# Patient Record
Sex: Male | Born: 1985 | Hispanic: Yes | Marital: Married | Smoking: Former smoker
Health system: Southern US, Community
[De-identification: ages and names within clinical notes are randomized; demographics above are authoritative.]

## PROBLEM LIST (undated history)

## (undated) DIAGNOSIS — E785 Hyperlipidemia, unspecified: Secondary | ICD-10-CM

## (undated) DIAGNOSIS — M791 Myalgia, unspecified site: Secondary | ICD-10-CM

## (undated) DIAGNOSIS — E7800 Pure hypercholesterolemia, unspecified: Secondary | ICD-10-CM

## (undated) DIAGNOSIS — R7989 Other specified abnormal findings of blood chemistry: Secondary | ICD-10-CM

## (undated) DIAGNOSIS — A6000 Herpesviral infection of urogenital system, unspecified: Secondary | ICD-10-CM

## (undated) DIAGNOSIS — F419 Anxiety disorder, unspecified: Secondary | ICD-10-CM

## (undated) DIAGNOSIS — Z9109 Other allergy status, other than to drugs and biological substances: Secondary | ICD-10-CM

## (undated) DIAGNOSIS — T7840XA Allergy, unspecified, initial encounter: Secondary | ICD-10-CM

## (undated) DIAGNOSIS — F524 Premature ejaculation: Secondary | ICD-10-CM

## (undated) DIAGNOSIS — B360 Pityriasis versicolor: Secondary | ICD-10-CM

## (undated) DIAGNOSIS — R079 Chest pain, unspecified: Secondary | ICD-10-CM

## (undated) DIAGNOSIS — E236 Other disorders of pituitary gland: Secondary | ICD-10-CM

## (undated) HISTORY — DX: Herpesviral infection of urogenital system, unspecified: A60.00

## (undated) HISTORY — DX: Other allergy status, other than to drugs and biological substances: Z91.09

## (undated) HISTORY — DX: Pure hypercholesterolemia, unspecified: E78.00

## (undated) HISTORY — DX: Myalgia, unspecified site: M79.10

## (undated) HISTORY — DX: Other disorders of pituitary gland: E23.6

## (undated) HISTORY — DX: Pityriasis versicolor: B36.0

## (undated) HISTORY — DX: Chest pain, unspecified: R07.9

## (undated) HISTORY — DX: Anxiety disorder, unspecified: F41.9

## (undated) HISTORY — DX: Allergy, unspecified, initial encounter: T78.40XA

## (undated) HISTORY — DX: Premature ejaculation: F52.4

## (undated) HISTORY — DX: Hyperlipidemia, unspecified: E78.5

---

## 2011-11-27 ENCOUNTER — Other Ambulatory Visit: Payer: Self-pay | Admitting: Allergy and Immunology

## 2011-11-27 ENCOUNTER — Ambulatory Visit
Admission: RE | Admit: 2011-11-27 | Discharge: 2011-11-27 | Disposition: A | Discharge status: Home / Self Care | Payer: 59 | Source: Ambulatory Visit | Attending: Allergy and Immunology | Admitting: Allergy and Immunology

## 2011-11-27 DIAGNOSIS — J45909 Unspecified asthma, uncomplicated: Secondary | ICD-10-CM

## 2016-06-11 ENCOUNTER — Other Ambulatory Visit: Payer: Self-pay | Admitting: Urology

## 2016-06-11 ENCOUNTER — Other Ambulatory Visit (HOSPITAL_COMMUNITY): Payer: Self-pay | Admitting: Urology

## 2016-06-11 DIAGNOSIS — E290 Testicular hyperfunction: Secondary | ICD-10-CM

## 2016-06-11 DIAGNOSIS — N62 Hypertrophy of breast: Secondary | ICD-10-CM

## 2016-06-16 ENCOUNTER — Ambulatory Visit (HOSPITAL_COMMUNITY): Admission: RE | Admit: 2016-06-16 | Payer: 59 | Source: Ambulatory Visit

## 2016-06-16 ENCOUNTER — Ambulatory Visit
Admission: RE | Admit: 2016-06-16 | Discharge: 2016-06-16 | Disposition: A | Discharge status: Home / Self Care | Payer: 59 | Source: Ambulatory Visit | Attending: Urology | Admitting: Urology

## 2016-06-16 ENCOUNTER — Other Ambulatory Visit: Payer: Self-pay | Admitting: Urology

## 2016-06-16 DIAGNOSIS — N62 Hypertrophy of breast: Secondary | ICD-10-CM

## 2016-07-06 ENCOUNTER — Ambulatory Visit (HOSPITAL_COMMUNITY)
Admission: RE | Admit: 2016-07-06 | Discharge: 2016-07-06 | Disposition: A | Discharge status: Home / Self Care | Payer: Managed Care, Other (non HMO) | Source: Ambulatory Visit | Attending: Urology | Admitting: Urology

## 2016-07-06 DIAGNOSIS — E290 Testicular hyperfunction: Secondary | ICD-10-CM | POA: Insufficient documentation

## 2016-07-06 MED ORDER — GADOBENATE DIMEGLUMINE 529 MG/ML IV SOLN
10.0000 mL | Freq: Once | INTRAVENOUS | Status: AC | PRN
  Administered 2016-07-06: 7 mL via INTRAVENOUS

## 2016-09-17 ENCOUNTER — Other Ambulatory Visit (HOSPITAL_COMMUNITY): Payer: Self-pay | Admitting: Family Medicine

## 2016-09-17 DIAGNOSIS — R011 Cardiac murmur, unspecified: Secondary | ICD-10-CM

## 2016-09-28 ENCOUNTER — Ambulatory Visit (HOSPITAL_COMMUNITY): Payer: Managed Care, Other (non HMO)

## 2018-06-23 DIAGNOSIS — E291 Testicular hypofunction: Secondary | ICD-10-CM | POA: Diagnosis not present

## 2018-06-29 DIAGNOSIS — B350 Tinea barbae and tinea capitis: Secondary | ICD-10-CM | POA: Diagnosis not present

## 2018-06-29 DIAGNOSIS — L590 Erythema ab igne [dermatitis ab igne]: Secondary | ICD-10-CM | POA: Diagnosis not present

## 2019-01-04 DIAGNOSIS — F4323 Adjustment disorder with mixed anxiety and depressed mood: Secondary | ICD-10-CM | POA: Diagnosis not present

## 2019-01-11 DIAGNOSIS — F4323 Adjustment disorder with mixed anxiety and depressed mood: Secondary | ICD-10-CM | POA: Diagnosis not present

## 2019-01-30 DIAGNOSIS — M9901 Segmental and somatic dysfunction of cervical region: Secondary | ICD-10-CM | POA: Diagnosis not present

## 2019-01-30 DIAGNOSIS — M9903 Segmental and somatic dysfunction of lumbar region: Secondary | ICD-10-CM | POA: Diagnosis not present

## 2019-01-30 DIAGNOSIS — F4323 Adjustment disorder with mixed anxiety and depressed mood: Secondary | ICD-10-CM | POA: Diagnosis not present

## 2019-01-30 DIAGNOSIS — M5416 Radiculopathy, lumbar region: Secondary | ICD-10-CM | POA: Diagnosis not present

## 2019-01-30 DIAGNOSIS — M9905 Segmental and somatic dysfunction of pelvic region: Secondary | ICD-10-CM | POA: Diagnosis not present

## 2019-02-08 DIAGNOSIS — M9905 Segmental and somatic dysfunction of pelvic region: Secondary | ICD-10-CM | POA: Diagnosis not present

## 2019-02-08 DIAGNOSIS — M9903 Segmental and somatic dysfunction of lumbar region: Secondary | ICD-10-CM | POA: Diagnosis not present

## 2019-02-08 DIAGNOSIS — M5416 Radiculopathy, lumbar region: Secondary | ICD-10-CM | POA: Diagnosis not present

## 2019-02-08 DIAGNOSIS — M9901 Segmental and somatic dysfunction of cervical region: Secondary | ICD-10-CM | POA: Diagnosis not present

## 2019-02-09 DIAGNOSIS — F4323 Adjustment disorder with mixed anxiety and depressed mood: Secondary | ICD-10-CM | POA: Diagnosis not present

## 2019-02-15 DIAGNOSIS — M5416 Radiculopathy, lumbar region: Secondary | ICD-10-CM | POA: Diagnosis not present

## 2019-02-15 DIAGNOSIS — M9903 Segmental and somatic dysfunction of lumbar region: Secondary | ICD-10-CM | POA: Diagnosis not present

## 2019-02-15 DIAGNOSIS — M9901 Segmental and somatic dysfunction of cervical region: Secondary | ICD-10-CM | POA: Diagnosis not present

## 2019-02-15 DIAGNOSIS — M9905 Segmental and somatic dysfunction of pelvic region: Secondary | ICD-10-CM | POA: Diagnosis not present

## 2019-02-22 DIAGNOSIS — M5416 Radiculopathy, lumbar region: Secondary | ICD-10-CM | POA: Diagnosis not present

## 2019-02-22 DIAGNOSIS — M9903 Segmental and somatic dysfunction of lumbar region: Secondary | ICD-10-CM | POA: Diagnosis not present

## 2019-02-22 DIAGNOSIS — M9901 Segmental and somatic dysfunction of cervical region: Secondary | ICD-10-CM | POA: Diagnosis not present

## 2019-02-22 DIAGNOSIS — M9905 Segmental and somatic dysfunction of pelvic region: Secondary | ICD-10-CM | POA: Diagnosis not present

## 2019-03-09 DIAGNOSIS — E291 Testicular hypofunction: Secondary | ICD-10-CM | POA: Diagnosis not present

## 2019-03-09 DIAGNOSIS — Z3009 Encounter for other general counseling and advice on contraception: Secondary | ICD-10-CM | POA: Diagnosis not present

## 2019-03-10 DIAGNOSIS — M9905 Segmental and somatic dysfunction of pelvic region: Secondary | ICD-10-CM | POA: Diagnosis not present

## 2019-03-10 DIAGNOSIS — M5416 Radiculopathy, lumbar region: Secondary | ICD-10-CM | POA: Diagnosis not present

## 2019-03-10 DIAGNOSIS — M9903 Segmental and somatic dysfunction of lumbar region: Secondary | ICD-10-CM | POA: Diagnosis not present

## 2019-03-10 DIAGNOSIS — M9901 Segmental and somatic dysfunction of cervical region: Secondary | ICD-10-CM | POA: Diagnosis not present

## 2019-03-15 DIAGNOSIS — F4323 Adjustment disorder with mixed anxiety and depressed mood: Secondary | ICD-10-CM | POA: Diagnosis not present

## 2019-03-29 DIAGNOSIS — M5416 Radiculopathy, lumbar region: Secondary | ICD-10-CM | POA: Diagnosis not present

## 2019-03-29 DIAGNOSIS — M9901 Segmental and somatic dysfunction of cervical region: Secondary | ICD-10-CM | POA: Diagnosis not present

## 2019-03-29 DIAGNOSIS — M9903 Segmental and somatic dysfunction of lumbar region: Secondary | ICD-10-CM | POA: Diagnosis not present

## 2019-03-29 DIAGNOSIS — M9905 Segmental and somatic dysfunction of pelvic region: Secondary | ICD-10-CM | POA: Diagnosis not present

## 2019-03-31 DIAGNOSIS — Z302 Encounter for sterilization: Secondary | ICD-10-CM | POA: Diagnosis not present

## 2019-04-19 DIAGNOSIS — M9903 Segmental and somatic dysfunction of lumbar region: Secondary | ICD-10-CM | POA: Diagnosis not present

## 2019-04-19 DIAGNOSIS — M9901 Segmental and somatic dysfunction of cervical region: Secondary | ICD-10-CM | POA: Diagnosis not present

## 2019-04-19 DIAGNOSIS — M5416 Radiculopathy, lumbar region: Secondary | ICD-10-CM | POA: Diagnosis not present

## 2019-04-19 DIAGNOSIS — M9905 Segmental and somatic dysfunction of pelvic region: Secondary | ICD-10-CM | POA: Diagnosis not present

## 2019-04-27 DIAGNOSIS — R062 Wheezing: Secondary | ICD-10-CM | POA: Diagnosis not present

## 2019-08-03 DIAGNOSIS — Z20828 Contact with and (suspected) exposure to other viral communicable diseases: Secondary | ICD-10-CM | POA: Diagnosis not present

## 2019-08-09 ENCOUNTER — Other Ambulatory Visit: Payer: Self-pay

## 2019-08-09 DIAGNOSIS — Z20822 Contact with and (suspected) exposure to covid-19: Secondary | ICD-10-CM

## 2019-08-11 LAB — NOVEL CORONAVIRUS, NAA: SARS-CoV-2, NAA: NOT DETECTED

## 2020-04-04 DIAGNOSIS — M9905 Segmental and somatic dysfunction of pelvic region: Secondary | ICD-10-CM | POA: Diagnosis not present

## 2020-04-04 DIAGNOSIS — M5416 Radiculopathy, lumbar region: Secondary | ICD-10-CM | POA: Diagnosis not present

## 2020-04-04 DIAGNOSIS — M9901 Segmental and somatic dysfunction of cervical region: Secondary | ICD-10-CM | POA: Diagnosis not present

## 2020-04-04 DIAGNOSIS — M9903 Segmental and somatic dysfunction of lumbar region: Secondary | ICD-10-CM | POA: Diagnosis not present

## 2020-05-24 DIAGNOSIS — R0789 Other chest pain: Secondary | ICD-10-CM | POA: Diagnosis not present

## 2020-05-24 DIAGNOSIS — L089 Local infection of the skin and subcutaneous tissue, unspecified: Secondary | ICD-10-CM | POA: Diagnosis not present

## 2020-05-25 ENCOUNTER — Encounter (HOSPITAL_COMMUNITY): Payer: Self-pay | Admitting: Emergency Medicine

## 2020-05-25 ENCOUNTER — Emergency Department (HOSPITAL_COMMUNITY): Payer: BC Managed Care – PPO

## 2020-05-25 ENCOUNTER — Emergency Department (HOSPITAL_COMMUNITY)
Admission: EM | Admit: 2020-05-25 | Discharge: 2020-05-26 | Disposition: A | Discharge status: Home / Self Care | Payer: BC Managed Care – PPO | Attending: Emergency Medicine | Admitting: Emergency Medicine

## 2020-05-25 ENCOUNTER — Other Ambulatory Visit: Payer: Self-pay

## 2020-05-25 DIAGNOSIS — R0602 Shortness of breath: Secondary | ICD-10-CM | POA: Diagnosis not present

## 2020-05-25 DIAGNOSIS — R079 Chest pain, unspecified: Secondary | ICD-10-CM | POA: Diagnosis not present

## 2020-05-25 DIAGNOSIS — R112 Nausea with vomiting, unspecified: Secondary | ICD-10-CM | POA: Insufficient documentation

## 2020-05-25 DIAGNOSIS — Z5321 Procedure and treatment not carried out due to patient leaving prior to being seen by health care provider: Secondary | ICD-10-CM | POA: Diagnosis not present

## 2020-05-25 DIAGNOSIS — R61 Generalized hyperhidrosis: Secondary | ICD-10-CM | POA: Insufficient documentation

## 2020-05-25 HISTORY — DX: Other specified abnormal findings of blood chemistry: R79.89

## 2020-05-25 LAB — CBC
HCT: 44.3 % (ref 39.0–52.0)
Hemoglobin: 14.4 g/dL (ref 13.0–17.0)
MCH: 29.7 pg (ref 26.0–34.0)
MCHC: 32.5 g/dL (ref 30.0–36.0)
MCV: 91.3 fL (ref 80.0–100.0)
Platelets: 182 10*3/uL (ref 150–400)
RBC: 4.85 MIL/uL (ref 4.22–5.81)
RDW: 13 % (ref 11.5–15.5)
WBC: 8.1 10*3/uL (ref 4.0–10.5)
nRBC: 0 % (ref 0.0–0.2)

## 2020-05-25 NOTE — ED Triage Notes (Signed)
Patient reports intermittent CP/SOB, diaphoresis, N/V X1 week. Patient anxious in triage. In NAD.

## 2020-05-26 LAB — BASIC METABOLIC PANEL
Anion gap: 9 (ref 5–15)
BUN: 19 mg/dL (ref 6–20)
CO2: 28 mmol/L (ref 22–32)
Calcium: 8.5 mg/dL — ABNORMAL LOW (ref 8.9–10.3)
Chloride: 101 mmol/L (ref 98–111)
Creatinine, Ser: 1.25 mg/dL — ABNORMAL HIGH (ref 0.61–1.24)
GFR calc Af Amer: >60 mL/min (ref >60)
GFR calc non Af Amer: >60 mL/min (ref >60)
Glucose, Bld: 117 mg/dL — ABNORMAL HIGH (ref 70–99)
Potassium: 3.7 mmol/L (ref 3.5–5.1)
Sodium: 138 mmol/L (ref 135–145)

## 2020-05-26 LAB — TROPONIN I (HIGH SENSITIVITY)
Troponin I (High Sensitivity): 7 ng/L (ref <18)
Troponin I (High Sensitivity): 5 ng/L (ref <18)

## 2020-05-26 NOTE — ED Notes (Signed)
Called pt name for VS recheck. No response from pt.  

## 2020-05-27 DIAGNOSIS — R0789 Other chest pain: Secondary | ICD-10-CM | POA: Diagnosis not present

## 2020-05-27 DIAGNOSIS — E785 Hyperlipidemia, unspecified: Secondary | ICD-10-CM | POA: Diagnosis not present

## 2020-06-03 DIAGNOSIS — F411 Generalized anxiety disorder: Secondary | ICD-10-CM | POA: Diagnosis not present

## 2020-08-06 DIAGNOSIS — Z113 Encounter for screening for infections with a predominantly sexual mode of transmission: Secondary | ICD-10-CM | POA: Diagnosis not present

## 2020-08-06 DIAGNOSIS — B360 Pityriasis versicolor: Secondary | ICD-10-CM | POA: Diagnosis not present

## 2020-08-06 DIAGNOSIS — F411 Generalized anxiety disorder: Secondary | ICD-10-CM | POA: Diagnosis not present

## 2020-08-16 DIAGNOSIS — F4325 Adjustment disorder with mixed disturbance of emotions and conduct: Secondary | ICD-10-CM | POA: Diagnosis not present

## 2020-08-23 DIAGNOSIS — F4325 Adjustment disorder with mixed disturbance of emotions and conduct: Secondary | ICD-10-CM | POA: Diagnosis not present

## 2020-09-04 DIAGNOSIS — F4325 Adjustment disorder with mixed disturbance of emotions and conduct: Secondary | ICD-10-CM | POA: Diagnosis not present

## 2020-09-24 DIAGNOSIS — A6000 Herpesviral infection of urogenital system, unspecified: Secondary | ICD-10-CM | POA: Diagnosis not present

## 2020-09-24 DIAGNOSIS — L0390 Cellulitis, unspecified: Secondary | ICD-10-CM | POA: Diagnosis not present

## 2020-10-03 DIAGNOSIS — R6889 Other general symptoms and signs: Secondary | ICD-10-CM | POA: Diagnosis not present

## 2020-10-22 DIAGNOSIS — A6000 Herpesviral infection of urogenital system, unspecified: Secondary | ICD-10-CM | POA: Diagnosis not present

## 2020-10-22 DIAGNOSIS — J069 Acute upper respiratory infection, unspecified: Secondary | ICD-10-CM | POA: Diagnosis not present

## 2020-12-13 DIAGNOSIS — J029 Acute pharyngitis, unspecified: Secondary | ICD-10-CM | POA: Diagnosis not present

## 2021-01-06 DIAGNOSIS — M9904 Segmental and somatic dysfunction of sacral region: Secondary | ICD-10-CM | POA: Diagnosis not present

## 2021-01-06 DIAGNOSIS — M9905 Segmental and somatic dysfunction of pelvic region: Secondary | ICD-10-CM | POA: Diagnosis not present

## 2021-01-06 DIAGNOSIS — S336XXA Sprain of sacroiliac joint, initial encounter: Secondary | ICD-10-CM | POA: Diagnosis not present

## 2021-01-06 DIAGNOSIS — M9903 Segmental and somatic dysfunction of lumbar region: Secondary | ICD-10-CM | POA: Diagnosis not present

## 2021-01-07 DIAGNOSIS — M549 Dorsalgia, unspecified: Secondary | ICD-10-CM | POA: Diagnosis not present

## 2021-01-09 DIAGNOSIS — M9905 Segmental and somatic dysfunction of pelvic region: Secondary | ICD-10-CM | POA: Diagnosis not present

## 2021-01-09 DIAGNOSIS — S336XXA Sprain of sacroiliac joint, initial encounter: Secondary | ICD-10-CM | POA: Diagnosis not present

## 2021-01-09 DIAGNOSIS — M9903 Segmental and somatic dysfunction of lumbar region: Secondary | ICD-10-CM | POA: Diagnosis not present

## 2021-01-09 DIAGNOSIS — M9904 Segmental and somatic dysfunction of sacral region: Secondary | ICD-10-CM | POA: Diagnosis not present

## 2021-03-27 DIAGNOSIS — S76212A Strain of adductor muscle, fascia and tendon of left thigh, initial encounter: Secondary | ICD-10-CM | POA: Diagnosis not present

## 2021-03-27 DIAGNOSIS — B360 Pityriasis versicolor: Secondary | ICD-10-CM | POA: Diagnosis not present

## 2021-04-08 DIAGNOSIS — S76212A Strain of adductor muscle, fascia and tendon of left thigh, initial encounter: Secondary | ICD-10-CM | POA: Diagnosis not present

## 2021-04-15 DIAGNOSIS — S76212A Strain of adductor muscle, fascia and tendon of left thigh, initial encounter: Secondary | ICD-10-CM | POA: Diagnosis not present

## 2021-05-14 DIAGNOSIS — F524 Premature ejaculation: Secondary | ICD-10-CM | POA: Diagnosis not present

## 2021-05-14 DIAGNOSIS — A6000 Herpesviral infection of urogenital system, unspecified: Secondary | ICD-10-CM | POA: Diagnosis not present

## 2021-08-13 IMAGING — CR DG CHEST 2V
2 series · 2 of 2 positions shown · non-contrast
Comparison: November 25, 2011

CLINICAL DATA: Chest pain

EXAM:
CHEST - 2 VIEW

[chest pa]
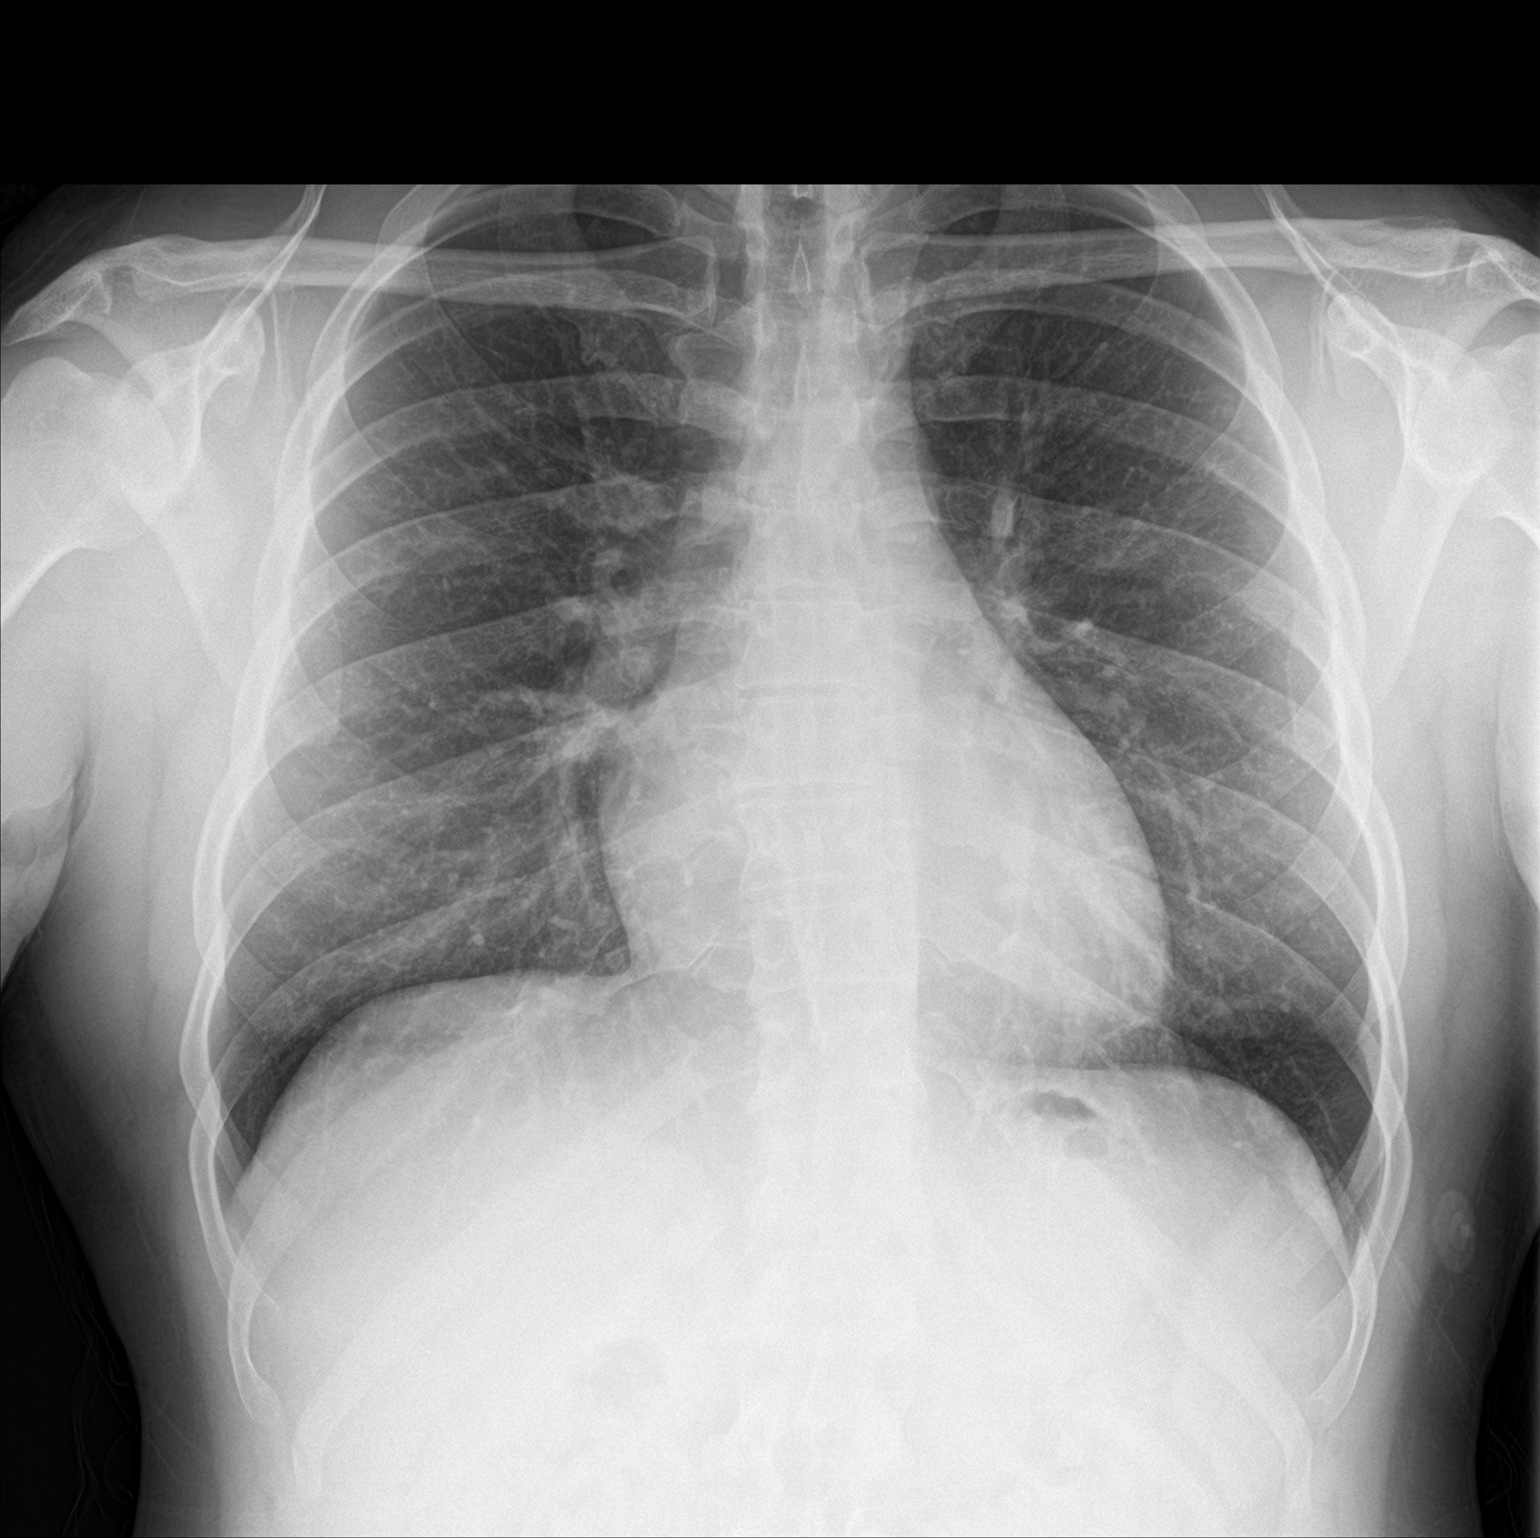

[chest lat]
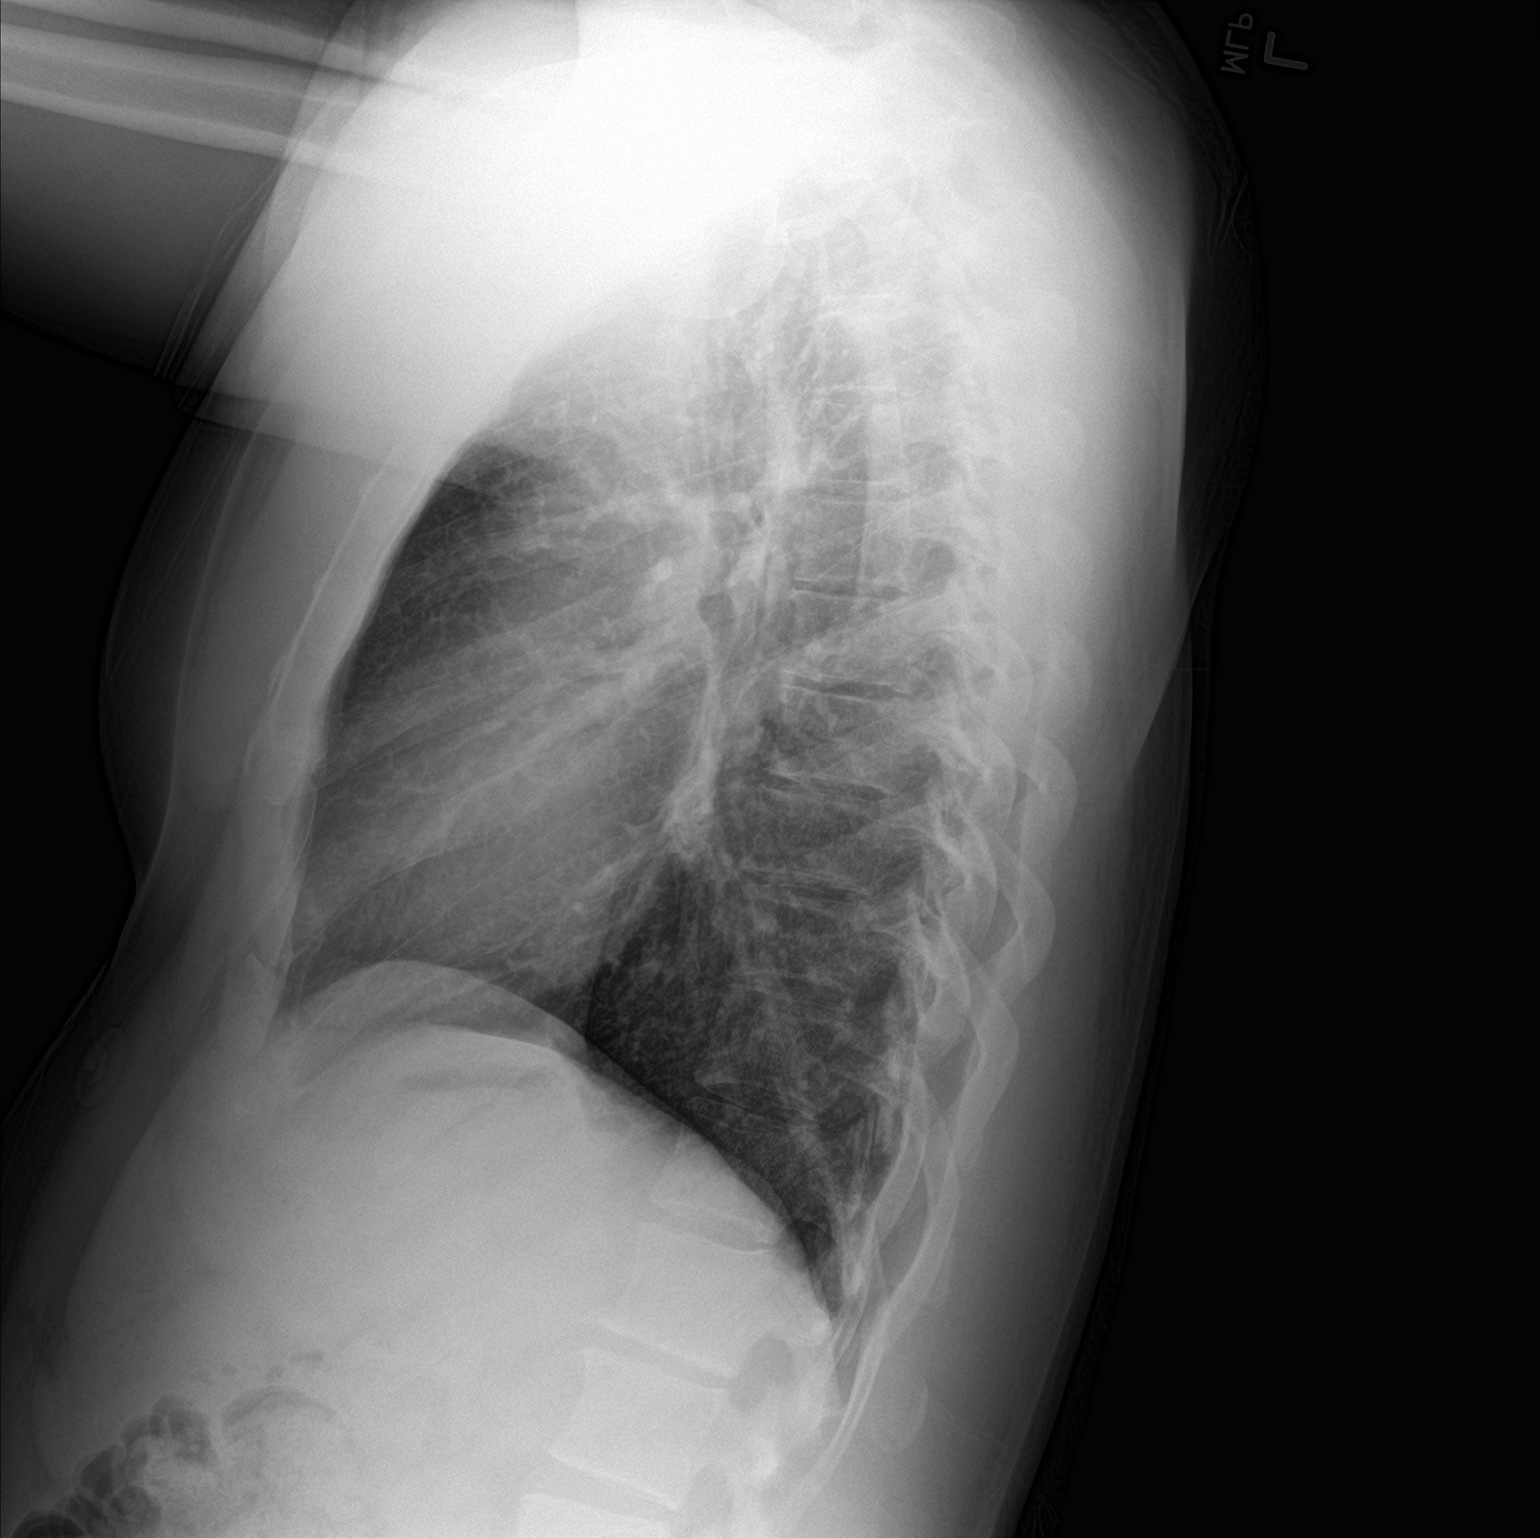

[2 of 2 positions shown; findings below may reference images not displayed]

FINDINGS: The heart size and mediastinal contours are within normal limits.
Both lungs are clear. The visualized skeletal structures are
unremarkable.
IMPRESSION: No active cardiopulmonary disease.

## 2021-10-13 DIAGNOSIS — R0789 Other chest pain: Secondary | ICD-10-CM | POA: Diagnosis not present

## 2021-10-13 DIAGNOSIS — M791 Myalgia, unspecified site: Secondary | ICD-10-CM | POA: Diagnosis not present

## 2021-10-16 DIAGNOSIS — M25561 Pain in right knee: Secondary | ICD-10-CM | POA: Diagnosis not present

## 2021-10-16 DIAGNOSIS — M25522 Pain in left elbow: Secondary | ICD-10-CM | POA: Diagnosis not present

## 2021-10-16 DIAGNOSIS — M25562 Pain in left knee: Secondary | ICD-10-CM | POA: Diagnosis not present

## 2021-10-16 DIAGNOSIS — M25521 Pain in right elbow: Secondary | ICD-10-CM | POA: Diagnosis not present

## 2021-10-16 DIAGNOSIS — R079 Chest pain, unspecified: Secondary | ICD-10-CM | POA: Diagnosis not present

## 2021-10-18 NOTE — Progress Notes (Signed)
Cardiology Office Note:    Date:  10/20/2021   ID:  Robert Sanford, DOB 09-25-1985, MRN [id number]  PCP:  Jamie Kato   [place] Providers Cardiologist:  Lenna Sciara, MD Referring MD: London Pepper, MD   Chief Complaint/Reason for Referral: Chest pain  ASSESSMENT:    Precordial pain    PLAN:    In order of problems listed above:  1.  My sense is this is musculoskeletal and perhaps a myositis that is responsive to prednisone.  I will obtain an exercise echo stress test to evaluate further.  I will keep follow-up with me open-ended.          Shared Decision Making/Informed Consent The risks [chest pain, shortness of breath, cardiac arrhythmias, dizziness, blood pressure fluctuations, myocardial infarction, stroke/transient ischemic attack, and life-threatening complications (estimated to be 1 in 10,000)], benefits (risk stratification, diagnosing coronary artery disease, treatment guidance) and alternatives of a stress or dobutamine stress echocardiogram were discussed in detail with Robert Sanford and he agrees to proceed.   Dispo:  No follow-ups on file.     Medication Adjustments/Labs and Tests Ordered: Current medicines are reviewed at length with the patient today.  Concerns regarding medicines are outlined above.   Tests Ordered: No orders of the defined types were placed in this encounter.   Medication Changes: No orders of the defined types were placed in this encounter.   History of Present Illness:    The patient is a 36 y.o. male with the indicated medical history here for recommendations regarding chest pain.  Patient presented to an outside emergency department on January 27.  He was having anterior chest discomfort which he described as a pressure and burning.  An E[facility] done at [facility] ER demonstrated no acute ischemic changes and a chest x-ray was unremarkable.  His troponin was mildly elevated as well as his CPK (1200).  He was  ultimately discharged home.  Per office note the pain is at the center of her chest with burning into the left chest and shoulder.  Pain goes straight through his chest.  Per patient report it feels like a pulled muscle.  It is alleviated by rubbing the area.  CPK was drawn and found to be elevated at 317.  His PCP believe that this is likely due to strain from weightlifting.  He called his PCP back because he had more symptoms with some extension into the neck and bilateral ankle swelling.  He was ultimately prescribed prednisone.  This helped his symptoms entirely.  He has not had this dull chest ache since Sunday or Saturday.  Over the last few days he has been relatively well.  He denies any paroxysmal nocturnal dyspnea, orthopnea, or shortness of breath.  Of note he is under a lot of stress at work per his wife's report.       Previous Medical History: Past Medical History:  Diagnosis Date   Allergies    Anxiety    Chest pain    Environmental allergies    Genital herpes    High cholesterol    Hyperlipemia    Low testosterone    Myalgia    Pituitary cyst (HCC)    Premature ejaculation    Tinea versicolor      Current Medications: Current Meds  Medication Sig   cetirizine (ZYRTEC) 10 MG tablet Take 10 mg by mouth daily.   DULoxetine (CYMBALTA) 60 MG capsule Take 60 mg by mouth daily.   ibuprofen (ADVIL) 800  MG tablet Take 800 mg by mouth every 8 (eight) hours as needed.   predniSONE (DELTASONE) 10 MG tablet Take 10 mg by mouth 2 (two) times daily.   valACYclovir (VALTREX) 500 MG tablet Take 500 mg by mouth 2 (two) times daily.   [DISCONTINUED] DULoxetine (CYMBALTA) 60 MG capsule Take 60 mg by mouth daily.     Allergies:    Patient has no known allergies.   Social History:   Social History   Tobacco Use   Smoking status: Former    Types: Cigarettes    Quit date: 10/18/2007    Years since quitting: 14.0   Smokeless tobacco: Never  Substance Use Topics   Alcohol use: Yes    Drug use: Not Currently     Family Hx: No family history on file.   Review of Systems:   Please see the history of present illness.    All other systems reviewed and are negative.     EKGs/Labs/Other Test Reviewed:    EKG: EKG demonstrates sinus rhythm with incomplete right bundle branch block with J-point elevation  EKG from 2021 demonstrates sinus rhythm with incomplete right bundle branch block with J-point elevation  Prior CV studies: None available  Imaging studies that I have independently reviewed today: None available or relevant  Recent Labs: No results found for requested labs within last 8760 hours.   Recent Lipid Panel No results found for: CHOL, TRIG, HDL, LDLCALC, LDLDIRECT  Risk Assessment/Calculations:           Physical Exam:    VS:  BP 112/60 (BP Location: Left Arm, Patient Position: Sitting, Cuff Size: Normal)    Pulse 76    Ht 5\' 7"  (1.702 m)    Wt 208 lb (94.3 kg)    SpO2 95%    BMI 32.58 kg/m    Wt Readings from Last 3 Encounters:  10/20/21 208 lb (94.3 kg)  05/25/20 195 lb (88.5 kg)    GENERAL:  No apparent distress, AOx3 HEENT:  No carotid bruits, +2 carotid impulses, no scleral icterus CAR: RRR no murmurs, gallops, rubs, or thrills RES:  Clear to auscultation bilaterally ABD:  Soft, nontender, nondistended, positive bowel sounds x 4 VASC:  +2 radial pulses, +2 carotid pulses, palpable pedal pulses NEURO:  CN 2-12 grossly intact; motor and sensory grossly intact PSYCH:  No active depression or anxiety EXT:  No edema, ecchymosis, or cyanosis  Signed, [name], MD  10/20/2021 2:01 [facility] [place], [place], [place]  [address] Phone: [phone]; Fax: [phone]   Note:  This document was prepared using Dragon voice recognition software and may include unintentional dictation errors.

## 2021-10-20 ENCOUNTER — Ambulatory Visit: Payer: BC Managed Care – PPO | Admitting: Internal Medicine

## 2021-10-20 ENCOUNTER — Other Ambulatory Visit: Payer: Self-pay

## 2021-10-20 ENCOUNTER — Encounter: Payer: Self-pay | Admitting: Internal Medicine

## 2021-10-20 VITALS — BP 112/60 | HR 76 | Ht 67.0 in | Wt 208.0 lb

## 2021-10-20 DIAGNOSIS — R748 Abnormal levels of other serum enzymes: Secondary | ICD-10-CM | POA: Diagnosis not present

## 2021-10-20 DIAGNOSIS — R072 Precordial pain: Secondary | ICD-10-CM | POA: Diagnosis not present

## 2021-10-20 NOTE — Addendum Note (Signed)
Addended by: Julio Sicks on: 10/20/2021 05:31 PM   Modules accepted: Orders

## 2021-10-20 NOTE — Addendum Note (Signed)
Addended by: Alverda Skeans on: 10/20/2021 05:33 PM   Modules accepted: Orders

## 2021-10-20 NOTE — Patient Instructions (Signed)
Medication Instructions:  Your physician recommends that you continue on your current medications as directed. Please refer to the Current Medication list given to you today.  *If you need a refill on your cardiac medications before your next appointment, please call your pharmacy*   Lab Work: None If you have labs (blood work) drawn today and your tests are completely normal, you will receive your results only by: MyChart Message (if you have MyChart) OR A paper copy in the mail If you have any lab test that is abnormal or we need to change your treatment, we will call you to review the results.   Testing/Procedures: Your physician has requested that you have a stress echocardiogram. For further information please visit [email or web address] Please follow instruction sheet as given.   Follow-Up: At [facility], you and your health needs are our priority.  As part of our continuing mission to provide you with exceptional heart care, we have created designated Provider Care Teams.  These Care Teams include your primary Cardiologist (physician) and Advanced Practice Providers (APPs -  Physician Assistants and Nurse Practitioners) who all work together to provide you with the care you need, when you need it.  We recommend signing up for the patient portal called "MyChart".  Sign up information is provided on this After Visit Summary.  MyChart is used to connect with patients for Virtual Visits (Telemedicine).  Patients are able to view lab/test results, encounter notes, upcoming appointments, etc.  Non-urgent messages can be sent to your provider as well.   To learn more about what you can do with MyChart, go to [email or web address].    Your next appointment:   As needed  The format for your next appointment:   In Person  Provider:   Dr. Alverda Skeans, MD    Other Instructions

## 2021-10-28 DIAGNOSIS — M9901 Segmental and somatic dysfunction of cervical region: Secondary | ICD-10-CM | POA: Diagnosis not present

## 2021-10-28 DIAGNOSIS — M9905 Segmental and somatic dysfunction of pelvic region: Secondary | ICD-10-CM | POA: Diagnosis not present

## 2021-10-28 DIAGNOSIS — M9903 Segmental and somatic dysfunction of lumbar region: Secondary | ICD-10-CM | POA: Diagnosis not present

## 2021-10-28 DIAGNOSIS — M9902 Segmental and somatic dysfunction of thoracic region: Secondary | ICD-10-CM | POA: Diagnosis not present

## 2021-10-29 ENCOUNTER — Telehealth (HOSPITAL_COMMUNITY): Payer: Self-pay | Admitting: Internal Medicine

## 2021-10-29 NOTE — Telephone Encounter (Signed)
Patient called and cancelled Stress echo for reason below:  10/29/2021 12:14 PM LH:TDSK, JENNIFER L  Cancel Rsn: Patient (patient does not need test at this time after talking with PCP)  Order will be removed from the WQ  and if patient calls back to reschedule we will reinstate the order.

## 2021-11-05 ENCOUNTER — Other Ambulatory Visit (HOSPITAL_COMMUNITY): Payer: BC Managed Care – PPO

## 2023-11-04 ENCOUNTER — Emergency Department (HOSPITAL_COMMUNITY): Payer: Commercial Managed Care - PPO

## 2023-11-04 ENCOUNTER — Inpatient Hospital Stay (HOSPITAL_COMMUNITY): Payer: Commercial Managed Care - PPO

## 2023-11-04 ENCOUNTER — Encounter (HOSPITAL_COMMUNITY): Payer: Self-pay | Admitting: Emergency Medicine

## 2023-11-04 ENCOUNTER — Other Ambulatory Visit: Payer: Self-pay

## 2023-11-04 ENCOUNTER — Observation Stay (HOSPITAL_COMMUNITY)
Admission: EM | Admit: 2023-11-04 | Discharge: 2023-11-09 | Disposition: A | Discharge status: Home / Self Care | Payer: Commercial Managed Care - PPO | Attending: Student | Admitting: Student

## 2023-11-04 DIAGNOSIS — R6520 Severe sepsis without septic shock: Secondary | ICD-10-CM | POA: Insufficient documentation

## 2023-11-04 DIAGNOSIS — Z87891 Personal history of nicotine dependence: Secondary | ICD-10-CM | POA: Insufficient documentation

## 2023-11-04 DIAGNOSIS — R55 Syncope and collapse: Secondary | ICD-10-CM | POA: Insufficient documentation

## 2023-11-04 DIAGNOSIS — Z1152 Encounter for screening for COVID-19: Secondary | ICD-10-CM | POA: Insufficient documentation

## 2023-11-04 DIAGNOSIS — K3189 Other diseases of stomach and duodenum: Secondary | ICD-10-CM | POA: Insufficient documentation

## 2023-11-04 DIAGNOSIS — K2289 Other specified disease of esophagus: Secondary | ICD-10-CM | POA: Diagnosis not present

## 2023-11-04 DIAGNOSIS — Z79899 Other long term (current) drug therapy: Secondary | ICD-10-CM | POA: Insufficient documentation

## 2023-11-04 DIAGNOSIS — E875 Hyperkalemia: Secondary | ICD-10-CM | POA: Insufficient documentation

## 2023-11-04 DIAGNOSIS — R748 Abnormal levels of other serum enzymes: Secondary | ICD-10-CM | POA: Insufficient documentation

## 2023-11-04 DIAGNOSIS — D62 Acute posthemorrhagic anemia: Secondary | ICD-10-CM | POA: Diagnosis not present

## 2023-11-04 DIAGNOSIS — A419 Sepsis, unspecified organism: Secondary | ICD-10-CM | POA: Diagnosis not present

## 2023-11-04 DIAGNOSIS — R195 Other fecal abnormalities: Secondary | ICD-10-CM | POA: Insufficient documentation

## 2023-11-04 DIAGNOSIS — I82622 Acute embolism and thrombosis of deep veins of left upper extremity: Secondary | ICD-10-CM | POA: Diagnosis not present

## 2023-11-04 DIAGNOSIS — M6282 Rhabdomyolysis: Secondary | ICD-10-CM

## 2023-11-04 DIAGNOSIS — R7989 Other specified abnormal findings of blood chemistry: Secondary | ICD-10-CM | POA: Diagnosis not present

## 2023-11-04 DIAGNOSIS — K921 Melena: Secondary | ICD-10-CM | POA: Diagnosis present

## 2023-11-04 DIAGNOSIS — K922 Gastrointestinal hemorrhage, unspecified: Secondary | ICD-10-CM | POA: Diagnosis not present

## 2023-11-04 LAB — CBC WITH DIFFERENTIAL/PLATELET
Abs Immature Granulocytes: 0.87 10*3/uL — ABNORMAL HIGH (ref 0.00–0.07)
Basophils Absolute: 0.2 10*3/uL — ABNORMAL HIGH (ref 0.0–0.1)
Basophils Relative: 1 %
Eosinophils Absolute: 0.1 10*3/uL (ref 0.0–0.5)
Eosinophils Relative: 1 %
HCT: 33.8 % — ABNORMAL LOW (ref 39.0–52.0)
Hemoglobin: 11 g/dL — ABNORMAL LOW (ref 13.0–17.0)
Immature Granulocytes: 5 %
Lymphocytes Relative: 14 %
Lymphs Abs: 2.4 10*3/uL (ref 0.7–4.0)
MCH: 31.9 pg (ref 26.0–34.0)
MCHC: 32.5 g/dL (ref 30.0–36.0)
MCV: 98 fL (ref 80.0–100.0)
Monocytes Absolute: 1.4 10*3/uL — ABNORMAL HIGH (ref 0.1–1.0)
Monocytes Relative: 8 %
Neutro Abs: 12.3 10*3/uL — ABNORMAL HIGH (ref 1.7–7.7)
Neutrophils Relative %: 71 %
Platelets: 242 10*3/uL (ref 150–400)
RBC: 3.45 MIL/uL — ABNORMAL LOW (ref 4.22–5.81)
RDW: 13.8 % (ref 11.5–15.5)
WBC: 17.2 10*3/uL — ABNORMAL HIGH (ref 4.0–10.5)
nRBC: 0.1 % (ref 0.0–0.2)

## 2023-11-04 LAB — LACTIC ACID, PLASMA
Lactic Acid, Venous: 2 mmol/L (ref 0.5–1.9)
Lactic Acid, Venous: 2.1 mmol/L (ref 0.5–1.9)

## 2023-11-04 LAB — BASIC METABOLIC PANEL
Anion gap: 8 (ref 5–15)
BUN: 29 mg/dL — ABNORMAL HIGH (ref 6–20)
CO2: 29 mmol/L (ref 22–32)
Calcium: 8 mg/dL — ABNORMAL LOW (ref 8.9–10.3)
Chloride: 98 mmol/L (ref 98–111)
Creatinine, Ser: 1.18 mg/dL (ref 0.61–1.24)
GFR, Estimated: >60 mL/min (ref >60)
Glucose, Bld: 93 mg/dL (ref 70–99)
Potassium: 5.1 mmol/L (ref 3.5–5.1)
Sodium: 135 mmol/L (ref 135–145)

## 2023-11-04 LAB — MAGNESIUM: Magnesium: 1.7 mg/dL (ref 1.7–2.4)

## 2023-11-04 LAB — TROPONIN I (HIGH SENSITIVITY)
Troponin I (High Sensitivity): 81 ng/L — ABNORMAL HIGH (ref <18)
Troponin I (High Sensitivity): 151 ng/L (ref <18)

## 2023-11-04 LAB — URINALYSIS, W/ REFLEX TO CULTURE (INFECTION SUSPECTED)
Bacteria, UA: NONE SEEN
Bilirubin Urine: NEGATIVE
Glucose, UA: NEGATIVE mg/dL
Hgb urine dipstick: NEGATIVE
Ketones, ur: NEGATIVE mg/dL
Leukocytes,Ua: NEGATIVE
Nitrite: NEGATIVE
Protein, ur: NEGATIVE mg/dL
Specific Gravity, Urine: 1.028 (ref 1.005–1.030)
pH: 7 (ref 5.0–8.0)

## 2023-11-04 LAB — RAPID URINE DRUG SCREEN, HOSP PERFORMED
Amphetamines: NOT DETECTED
Barbiturates: NOT DETECTED
Benzodiazepines: NOT DETECTED
Cocaine: NOT DETECTED
Opiates: NOT DETECTED
Tetrahydrocannabinol: NOT DETECTED

## 2023-11-04 LAB — COMPREHENSIVE METABOLIC PANEL
ALT: 42 U/L (ref 0–44)
AST: 48 U/L — ABNORMAL HIGH (ref 15–41)
Albumin: 2.8 g/dL — ABNORMAL LOW (ref 3.5–5.0)
Alkaline Phosphatase: 21 U/L — ABNORMAL LOW (ref 38–126)
Anion gap: 7 (ref 5–15)
BUN: 33 mg/dL — ABNORMAL HIGH (ref 6–20)
CO2: 27 mmol/L (ref 22–32)
Calcium: 7.4 mg/dL — ABNORMAL LOW (ref 8.9–10.3)
Chloride: 99 mmol/L (ref 98–111)
Creatinine, Ser: 1.39 mg/dL — ABNORMAL HIGH (ref 0.61–1.24)
GFR, Estimated: >60 mL/min (ref >60)
Glucose, Bld: 131 mg/dL — ABNORMAL HIGH (ref 70–99)
Potassium: 6.7 mmol/L (ref 3.5–5.1)
Sodium: 133 mmol/L — ABNORMAL LOW (ref 135–145)
Total Bilirubin: 0.7 mg/dL (ref 0.0–1.2)
Total Protein: 4.9 g/dL — ABNORMAL LOW (ref 6.5–8.1)

## 2023-11-04 LAB — TSH: TSH: 1.855 u[IU]/mL (ref 0.350–4.500)

## 2023-11-04 LAB — RESP PANEL BY RT-PCR (RSV, FLU A&B, COVID)  RVPGX2
Influenza A by PCR: NEGATIVE
Influenza B by PCR: NEGATIVE
Resp Syncytial Virus by PCR: NEGATIVE
SARS Coronavirus 2 by RT PCR: NEGATIVE

## 2023-11-04 LAB — D-DIMER, QUANTITATIVE: D-Dimer, Quant: <0.27 ug{FEU}/mL (ref 0.00–0.50)

## 2023-11-04 LAB — POC OCCULT BLOOD, ED: Fecal Occult Bld: POSITIVE — AB

## 2023-11-04 LAB — CK: Total CK: 1382 U/L — ABNORMAL HIGH (ref 49–397)

## 2023-11-04 LAB — I-STAT CG4 LACTIC ACID, ED
Lactic Acid, Venous: 3.5 mmol/L (ref 0.5–1.9)
Lactic Acid, Venous: 2.4 mmol/L (ref 0.5–1.9)

## 2023-11-04 LAB — HIV ANTIBODY (ROUTINE TESTING W REFLEX): HIV Screen 4th Generation wRfx: NONREACTIVE

## 2023-11-04 MED ORDER — PIPERACILLIN-TAZOBACTAM 3.375 G IVPB 30 MIN
3.3750 g | Freq: Once | INTRAVENOUS | Status: AC
  Administered 2023-11-04: 3.375 g via INTRAVENOUS
  Filled 2023-11-04: qty 50

## 2023-11-04 MED ORDER — PANTOPRAZOLE SODIUM 40 MG IV SOLR
40.0000 mg | Freq: Two times a day (BID) | INTRAVENOUS | Status: DC
  Administered 2023-11-04 (×2): 40 mg via INTRAVENOUS
  Filled 2023-11-04 (×2): qty 10

## 2023-11-04 MED ORDER — LACTATED RINGERS IV BOLUS
1000.0000 mL | Freq: Once | INTRAVENOUS | Status: AC
  Administered 2023-11-04: 1000 mL via INTRAVENOUS

## 2023-11-04 MED ORDER — INSULIN ASPART 100 UNIT/ML IJ SOLN
6.0000 [IU] | Freq: Once | INTRAMUSCULAR | Status: AC
  Administered 2023-11-04: 6 [IU] via INTRAVENOUS

## 2023-11-04 MED ORDER — VANCOMYCIN HCL 1750 MG/350ML IV SOLN
1750.0000 mg | Freq: Once | INTRAVENOUS | Status: AC
  Administered 2023-11-04: 1750 mg via INTRAVENOUS
  Filled 2023-11-04: qty 350

## 2023-11-04 MED ORDER — LACTATED RINGERS IV BOLUS
500.0000 mL | Freq: Once | INTRAVENOUS | Status: AC
  Administered 2023-11-04: 500 mL via INTRAVENOUS

## 2023-11-04 MED ORDER — IOHEXOL 350 MG/ML SOLN
75.0000 mL | Freq: Once | INTRAVENOUS | Status: AC | PRN
Start: 2023-11-04 — End: 2023-11-04
  Administered 2023-11-04: 75 mL via INTRAVENOUS

## 2023-11-04 MED ORDER — SODIUM ZIRCONIUM CYCLOSILICATE 10 G PO PACK
10.0000 g | PACK | Freq: Once | ORAL | Status: AC
  Administered 2023-11-04: 10 g via ORAL
  Filled 2023-11-04: qty 1

## 2023-11-04 MED ORDER — DEXTROSE 50 % IV SOLN
25.0000 mL | Freq: Once | INTRAVENOUS | Status: AC
  Administered 2023-11-04: 25 mL via INTRAVENOUS
  Filled 2023-11-04: qty 50

## 2023-11-04 MED ORDER — CALCIUM GLUCONATE-NACL 1-0.675 GM/50ML-% IV SOLN
1.0000 g | Freq: Once | INTRAVENOUS | Status: AC
  Administered 2023-11-04: 1000 mg via INTRAVENOUS
  Filled 2023-11-04: qty 50

## 2023-11-04 NOTE — Plan of Care (Signed)

## 2023-11-04 NOTE — Hospital Course (Addendum)
 Acute GI bleed Symptomatic anemia  Patient reports history of melena for a few days with symptomatic anemia. He remained hemodynamically stable during hospitalization and *** receive blood transfusion. GI was consulted and performed an EGD which showed ***    Hyperkalemia Patient is hyperkalemic at 6.7. He received calcium gluconate in the emergency department. Repeat potassium was with in normal limits.   Syncope Patient most likely vasovagaled while having a BM on the toilet which caused him to synopsize in the setting of an acute GI bleed. Vasovagal syncope is further supported by the reported prodrome symptoms of lightheadedness and nausea. An EEG showed ***. Cardiology was consulted. coronary CTA  showed *** echocardiogram was ***. Orthostatic vital signs were ***.     Elevated CK I suspect the patient's elevated CK is due to his recent intense work out vs recent CPR. U/A was unremarkable without evidence of hemoglobinuria. Repeat CK was ***.  Lactic Acidosis: Most likely due to CPR performed, will monitor until it is resolved

## 2023-11-04 NOTE — ED Triage Notes (Signed)
 Pt BIB [facility] for syncope.  Pt found unresponsive on toilet by wife  2 min of CPR performed and pt roused before EMS arrival  PT has been feeling bad since yesterday, states he has been dehydrated.  States stools are dark for 3-4 days.  EMS states pt was orthostatic SBP 140 lying, 98 sitting.  1198/83 100% HR 92, CBG 114  18G L.AC

## 2023-11-04 NOTE — Consult Note (Addendum)
 Cardiology Consultation   Patient ID: Robert Sanford MRN: [id number]; DOB: [birth year]  Admit date: 11/04/2023 Date of Consult: 11/04/2023  PCP:  Verl Blalock   [facility] Providers Cardiologist:  Lynnette Caffey    Patient Profile:   Chrsitopher Sanford is a 38 y.o. male with a hx of hypercholesterolemia, and low testosterone (on testosterone replacement therapy) who is being seen 11/04/2023 for the evaluation of syncope at the request of Anders Simmonds DO.  History of Present Illness:   Mr. Castell is a 38 y.o. male who presented following a syncopal episode while on the toilet. Received CPR from his wife. Went to the bathroom because he felt like he needed to throw up. Felt like was having a bowel movement and next thing remembered wife was over him. His wife heard him fall and came over to help him immediately  In the emergency room was initially tachycardic D-dimer was negative. Labs in the emergency room showed CK 1382, K 6.7, Na 133, Glu 131, BUN 33, Cr 1.39, WBC 17.2, Hemo 11.0. Lactic acid 3.5. fecal occult positive, HsTn 81> 151 The Chest X-ray did not indicate any acute cardiopulmonary abnormality. The patient was admitted with possible sepsis and rhabdomyolysis  On physical exam he affirms the above history. Had GI upset with diarrhea and dark stool about 2 weeks ago after doing a 3 day fast. Takes Ibuprofen for his back pain. Stated that he felt severely dehydrated yesterday. Reported eating a high protein diet but denied using protein shakes.  Has had chronic chest pain for the past 2 years that feels like a pressure in the chest. It radiates to the back. Is not related to exertion. Reported no prior syncopal episodes. Does not recall having any symptoms before collapsing off the toilet. Reported that he always does strenuous routines at the gym twice a day. Did not feel well and had shortness of breath yesterday and still went to the gym and did high intensity  workout. Reports tobacco use and vaping.   Prior cardiac history Saw Dr Lynnette Caffey for chest pain on 10/20/2021. This was felt to most likely be musculoskeletal pain. A stress echo to help rule out cardiac causes. This was canceled by the patient.  Past Medical History:  Diagnosis Date   Allergies    Anxiety    Chest pain    Environmental allergies    Genital herpes    High cholesterol    Hyperlipemia    Low testosterone    Myalgia    Pituitary cyst (HCC)    Premature ejaculation    Tinea versicolor     History reviewed. No pertinent surgical history.     Inpatient Medications: Scheduled Meds:  Continuous Infusions:  calcium gluconate 1,000 mg (11/04/23 1314)   piperacillin-tazobactam     vancomycin     PRN Meds:   Allergies:   No Known Allergies  Social History:   Social History   Socioeconomic History   Marital status: Married    Spouse name: Not on file   Number of children: Not on file   Years of education: Not on file   Highest education level: Not on file  Occupational History   Not on file  Tobacco Use   Smoking status: Former    Current packs/day: 0.00    Types: Cigarettes    Quit date: 10/18/2007    Years since quitting: 16.0   Smokeless tobacco: Never  Substance and Sexual Activity   Alcohol use: Yes  Drug use: Not Currently   Sexual activity: Not on file  Other Topics Concern   Not on file  Social History Narrative   Not on file   Social Drivers of Health   Financial Resource Strain: Not on file  Food Insecurity: Not on file  Transportation Needs: Not on file  Physical Activity: Not on file  Stress: Not on file  Social Connections: Not on file  Intimate Partner Violence: Not on file    Family History:   History reviewed. No pertinent family history.   ROS:  Please see the history of present illness.   All other ROS reviewed and negative.     Physical Exam/Data:   Vitals:   11/04/23 1025 11/04/23 1030 11/04/23 1227  BP:  116/71    Pulse:  95   Resp:  17   Temp:  (!) 97.2 F (36.2 C) 98.5 F (36.9 C)  TempSrc:  Oral Oral  SpO2:  100%    No intake or output data in the 24 hours ending 11/04/23 1359    10/20/2021    1:35 PM 05/25/2020   11:14 PM  Last 3 Weights  Weight (lbs) 208 lb 195 lb  Weight (kg) 94.348 kg 88.451 kg     There is no height or weight on file to calculate BMI.  General:  Well nourished, well developed, in no acute distress HEENT: normal Neck: no JVD Vascular: Distal pulses 2+ bilaterally Cardiac:  normal S1, S2; RRR; systolic ejection murmur Lungs:  clear to auscultation bilaterally, no wheezing, rhonchi or rales  Ext: no edema Musculoskeletal:  No deformities, BUE and BLE strength normal and equal Skin: warm and dry. Pale on exam Neuro:  CNs 2-12 intact, no focal abnormalities noted Psych:  Normal affect   EKG:  The EKG was personally reviewed and demonstrates:  Peak t waves, normal sinus rhythm and no signs of acute ischemia. Telemetry:  Telemetry was personally reviewed and demonstrates:  tachycardia and in normal sinus rhythm  Relevant CV Studies:   Laboratory Data:  High Sensitivity Troponin:   Recent Labs  Lab 11/04/23 1038 11/04/23 1234  TROPONINIHS 81* 151*     Chemistry Recent Labs  Lab 11/04/23 1038  NA 133*  K 6.7*  CL 99  CO2 27  GLUCOSE 131*  BUN 33*  CREATININE 1.39*  CALCIUM 7.4*  GFRNONAA >60  ANIONGAP 7    Recent Labs  Lab 11/04/23 1038  PROT 4.9*  ALBUMIN 2.8*  AST 48*  ALT 42  ALKPHOS 21*  BILITOT 0.7   Lipids No results for input(s): "CHOL", "TRIG", "HDL", "LABVLDL", "LDLCALC", "CHOLHDL" in the last 168 hours.  Hematology Recent Labs  Lab 11/04/23 1038  WBC 17.2*  RBC 3.45*  HGB 11.0*  HCT 33.8*  MCV 98.0  MCH 31.9  MCHC 32.5  RDW 13.8  PLT 242   Thyroid  Recent Labs  Lab 11/04/23 1234  TSH 1.855    BNPNo results for input(s): "BNP", "PROBNP" in the last 168 hours.  DDimer  Recent Labs  Lab 11/04/23 1038   DDIMER <0.27     Radiology/Studies:  CT ABDOMEN PELVIS W CONTRAST Result Date: 11/04/2023 CLINICAL DATA:  Abdominal pain. EXAM: CT ABDOMEN AND PELVIS WITH CONTRAST TECHNIQUE: Multidetector CT imaging of the abdomen and pelvis was performed using the standard protocol following bolus administration of intravenous contrast. RADIATION DOSE REDUCTION: This exam was performed according to the departmental dose-optimization program which includes automated exposure control, adjustment of the mA and/or kV  according to patient size and/or use of iterative reconstruction technique. CONTRAST:  75mL OMNIPAQUE IOHEXOL 350 MG/ML SOLN COMPARISON:  None Available. FINDINGS: Lower chest: The visualized lung bases are clear. No intra-abdominal free air or free fluid. Hepatobiliary: The liver is unremarkable. No biliary dilatation. The gallbladder is unremarkable. Pancreas: Unremarkable. No pancreatic ductal dilatation or surrounding inflammatory changes. Spleen: Normal in size without focal abnormality. Adrenals/Urinary Tract: The adrenal glands unremarkable. Small left renal inferior pole cyst. There is no hydronephrosis on either side. There is symmetric enhancement and excretion of contrast by both kidneys. The visualized ureters and urinary bladder appear unremarkable. Stomach/Bowel: There is no bowel obstruction or active inflammation. The appendix is normal. Vascular/Lymphatic: The abdominal aorta and IVC are unremarkable. No portal venous gas. There is no adenopathy. Reproductive: The prostate and seminal vesicles are grossly unremarkable. No pelvic mass. Other: None Musculoskeletal: Mild diffuse subcutaneous edema. No fluid collection. No acute osseous pathology. IMPRESSION: 1. No acute intra-abdominal or pelvic pathology. 2. No bowel obstruction. Normal appendix. Electronically Signed   By: Elgie Collard M.D.   On: 11/04/2023 13:01   CT Head Wo Contrast Result Date: 11/04/2023 CLINICAL DATA:  Ataxia, head  trauma EXAM: CT HEAD WITHOUT CONTRAST TECHNIQUE: Contiguous axial images were obtained from the base of the skull through the vertex without intravenous contrast. RADIATION DOSE REDUCTION: This exam was performed according to the departmental dose-optimization program which includes automated exposure control, adjustment of the mA and/or kV according to patient size and/or use of iterative reconstruction technique. COMPARISON:  MRI head July 06, 2016. FINDINGS: Brain: No evidence of acute infarction, hemorrhage, hydrocephalus, extra-axial collection or mass lesion/mass effect. Vascular: No hyperdense vessel. Skull: No acute fracture. Sinuses/Orbits: Clear sinuses.  No acute orbital findings. Other: No mastoid effusions. IMPRESSION: No evidence of acute intracranial abnormality. Electronically Signed   By: Feliberto Harts M.D.   On: 11/04/2023 12:55   DG Chest Portable 1 View Result Date: 11/04/2023 CLINICAL DATA:  Syncope EXAM: PORTABLE CHEST 1 VIEW COMPARISON:  May 25, 2020. FINDINGS: The cardiomediastinal silhouette is unchanged in contour. No pleural effusion. No pneumothorax. No acute pleuroparenchymal abnormality. IMPRESSION: No acute cardiopulmonary abnormality. Electronically Signed   By: Meda Klinefelter M.D.   On: 11/04/2023 11:04     Assessment and Plan:  Syncope Was found syncopal after falling off the toilet by wife. Felt like vomiting when he went to bathroom. Has not had any prior syncopal events. Does not recall feeling lightheaded before synopsizing.  Has had GI upset for the past 2 weeks.  -- Syncopy likely secondary to dehydration.  -- Continue to monitor telemetry. -- Receiving IV fluids -- Ordered an Echo   Chronic chest pain Has had chest pain for the past 2 years. Is not associated with exertion. Radiates to his back. Had shortness of breath since yesterday. Reports prior cigarette use and occasional vaping. -- on testosterone therapy, but had a negative  D-Dimer -- Has a decreased hemoglobin of 11.0 secondary to a GI bleed. Elevated troponin's are most likely secondary to demand ischemia. -- Ordered an Echo -- Plan for a coronary CTA to rule out ischemia  Elevated CK and hyperkalemia  Potassium was initially 6.7 in the ER was given calcium gluconate, insulin and glucose. -- peak t waves on EKG. This is most likely secondary to rhabdomyolysis -- manage as per primary  Manage as per primary: Possible sepsis, GI bleed.  Risk Assessment/Risk Scores:   For questions or updates, please contact [facility] Please  consult [email or web address] for contact info under    Signed, Arabella Merles, PA-C  11/04/2023 1:59 PM    Attending Note:   The patient was seen and examined.  Agree with assessment and plan as noted above.  Changes made to the above note as needed.  Patient seen and independently examined with Arabella Merles, PA .   We discussed all aspects of the encounter. I agree with the assessment and plan as stated above.    Syncope :  his symptoms and labs findings are c/w volume depletion.   Low albumin, melanic stools for 2-3 weeks ,  elevated creatinine, CPK elevation   He works out regularly without any CP, dyspnea, or presyncope   His troponins are minimally elevated.   I doubt his syncope was due to an arrhythmia but he should be monitored for the next several days.  Could consider an OP event monitor  Will get echo    2.  Hx of chest pain :   his troponins are minimally elevated.   I suspect this is demand / supply mismatch.   I doubt this was an acute coronary syndrome  He does have a hx of CP several years ago and never went for his stress test.  ECG shows sinus tach.  Early repol pattern .  Had early repol abn on previous ecgs   I would like to get a coronary CTA while he is here .     I have spent a total of 40 minutes with patient reviewing hospital  notes , telemetry, EKGs, labs and examining patient as well as  establishing an assessment and plan that was discussed with the patient.  > 50% of time was spent in direct patient care.    Vesta Mixer, Montez Hageman., MD, [facility] 11/04/2023, 5:10 PM [address],  [address] Office [phone] Pager [phone]

## 2023-11-04 NOTE — H&P (Signed)
 Date: 11/04/2023               Patient Name:  Robert Sanford MRN: [id number]  DOB: [birth year] Age / Sex: 38 y.o., male   PCP: Sheliah Hatch, PA-C         Medical Service: Internal Medicine Teaching Service         Attending Physician: Dr. Dickie La, MD    First Contact: Dr. Faith Rogue Pager: [phone]  Second Contact: Dr. 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   Patient reports feeling unwell for about one month now. He reported that yesterday he woke up feeling fatigued and tired all day that was accompanied by nausea and no appetite. He reported going to the bathroom this morning and felt disoriented and was on the ground when his wife helped him get back to bed. He then went back to bed and woke up to use the restroom again. This time, he felt lightheaded and his body was "tingling", the last thing he remembered was sitting on the toilet feeling lightheaded with the trash can on the ground in front of him feeling like he was going to vomit. His wife stated that he was "convulsing" and stopped breathing. She was on the phone with 911 who recommended that she started CPR.  Wife also stated that he had urine on him; however, patient was on the toilet. Patient woke up to his wife doing CPR.   Patient also reports a few day history of melenic stools. He denied BRBPR. He endorses other symptoms such as fatigue, SOB, epigastric pain, and heartburn.   In the emergency department: Patient was tachycardic at 107 with a bP of 116/71. Initial labs: Ddimer WNL, K 6.7, Scr 1.39/ BUN 33, troponin of 81--> 151, Lactic acid 3.5,  positive FOBT.    Past Medical History: Past Medical History:  Diagnosis Date   Allergies    Anxiety    Chest pain    Environmental allergies    Genital herpes    High cholesterol    Hyperlipemia    Low testosterone    Myalgia    Pituitary cyst (HCC)    Premature ejaculation    Tinea versicolor    Past Surgical History: History reviewed. No pertinent surgical history.  Meds:  Current Meds  Medication Sig   ibuprofen (ADVIL) 800 MG tablet Take 800 mg by mouth daily as needed for mild pain (pain score 1-3) or moderate pain (pain score 4-6).   testosterone cypionate (DEPOTESTOSTERONE CYPIONATE) 200 MG/ML injection Inject 200 mg into the muscle every 14 (fourteen) days.   Allergies: Allergies as of 11/04/2023   (No Known Allergies)   Family History:  Family History  Problem Relation Age of Onset   Stroke Father     Social History:  Lives at home with wife and two sons. 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 Denies tobacco, alcohol, and illicit substance use. PCP is [facility].   Review of Systems: A complete ROS was negative except as per HPI.   Physical Exam: Blood pressure 116/71, pulse 95, temperature  98.5 F (36.9 C), temperature source Oral, resp. rate 17, SpO2 100%.  General:NAD, not ill appearing, somnolent  HEENT: Moist mucous membranes, no evidence of tongue biting  Cardiac:RRR, DP pulses present and symmetrical Pulmonary:normal effort on room air, clear to auscultate bilaterally  Abdominal:soft, non-tender Neuro:alert and awake, participating in conversation MSK:No gross abnormalities, No pitting edema appreciated  Skin:warm and dry Psych:  normal mood and effect   EKG: personally reviewed my interpretation is Sinus tachycardia   CXR: personally reviewed my interpretation is no acute cardiopulmonary abnormality   Assessment & Plan by Problem: Principal Problem:   Acute GI bleeding Active Problems:   Syncope   Hyperkalemia   Elevated CK  Acute GI  bleed Symptomatic anemia  Patient reports history of melena for a few days with symptomatic anemia. He is hemodynamically stable with a hemoglobin of 11.0, which is down from his baseline of 14.4. I suspect that he has an upper GI bleed due to recent ibuprofen use. GI is consulted.   Plan: -GI consulted, appreciate their recommendations -Trend CBC, transfuse for Hgb <7 -If patient becomes hemodynamically unstable, plan for CTA abdomen/pelvis -Start IV PPI 40mg  BID  -NPO diet placed   Hyperkalemia Patient is hyperkalemic at 6.7. He received calcium gluconate in the emergency department. Will repeat stat EKG.  Plan: -Repeat BMP now  Syncope Patient most likely vasovagaled while having a BM on the toilet which caused him to synopsize in the setting of an acute GI bleed. Vasovagal syncope is further supported by the reported prodrome symptoms of lightheadedness and nausea. Although patient's wife reported convulsion and urinary incontinence, I have a lower suspicion of seizures. There was no evidence of post-ictal confusion or tongue biting.  Plan: -Echocardiogram -Orthostatic vital signs  -Management of GI bleed as above -Cardiology consulted in the emergency department, appreciate their recommendations  -EEG  Elevated CK I suspect the patient's elevated CK is due to his recent intense work out vs recent CPR. U/A was unremarkable without evidence of hemoglobinuria.  Plan: -will repeat CK tomorrow morning   Lactic Acidosis: Most likely due to CPR performed, will monitor until it is resolved    Dispo: Admit patient to Inpatient with expected length of stay greater than 2 midnights.  Signed: Faith Rogue, DO Internal Medicine Resident: PGY-1  Please contact the on call pager after 5pm and during the weekends: [phone]  11/04/2023, 4:21 PM  After 5pm on weekdays and 1pm on weekends: On Call pager: [phone]

## 2023-11-04 NOTE — Progress Notes (Signed)
 EEG complete - results pending

## 2023-11-04 NOTE — ED Provider Notes (Signed)
 [place] EMERGENCY DEPARTMENT AT [facility] Provider Note  CSN: [id number] Arrival date & time: 11/04/23 1020  Chief Complaint(s) No chief complaint on file.  HPI Robert Sanford is a 38 y.o. male who is here today after he had a syncopal episode while in the toilet, and received CPR by his wife.  Patient's wife is not in the healthcare field.  Patient reports over the last few days he is felt unwell.  He says that he went to the bathroom this morning because he felt as though he needed to throw up.  Patient says that then he felt as though he needed to have a bowel movement, so he sat on the toilet and the next and he remembered was his wife over him.  Patient receives testosterone replacement therapy.  Patient denies any fever or chills.   Past Medical History Past Medical History:  Diagnosis Date   Allergies    Anxiety    Chest pain    Environmental allergies    Genital herpes    High cholesterol    Hyperlipemia    Low testosterone    Myalgia    Pituitary cyst (HCC)    Premature ejaculation    Tinea versicolor    There are no active problems to display for this patient.  Home Medication(s) Prior to Admission medications   Medication Sig Start Date End Date Taking? Authorizing Provider  ibuprofen (ADVIL) 800 MG tablet Take 800 mg by mouth daily as needed for mild pain (pain score 1-3) or moderate pain (pain score 4-6).   Yes [provider]  testosterone cypionate (DEPOTESTOSTERONE CYPIONATE) 200 MG/ML injection Inject 200 mg into the muscle every 14 (fourteen) days. 09/07/23  Yes [provider]  sertraline (ZOLOFT) 50 MG tablet Take 50 mg by mouth daily. Patient not taking: Reported on 11/04/2023 09/09/23   [provider]                                                                                                                                    Past Surgical History History reviewed. No pertinent surgical history. Family  History History reviewed. No pertinent family history.  Social History Social History   Tobacco Use   Smoking status: Former    Current packs/day: 0.00    Types: Cigarettes    Quit date: 10/18/2007    Years since quitting: 16.0   Smokeless tobacco: Never  Substance Use Topics   Alcohol use: Yes   Drug use: Not Currently   Allergies Patient has no known allergies.  Review of Systems Review of Systems  Physical Exam Vital Signs  I have reviewed the triage vital signs BP 116/71   Pulse 95   Temp 98.5 F (36.9 C) (Oral)   Resp 17   SpO2 100%   Physical Exam Vitals reviewed.  HENT:     Head: Normocephalic.  Eyes:     Pupils:  Pupils are equal, round, and reactive to light.  Cardiovascular:     Rate and Rhythm: Tachycardia present.  Pulmonary:     Effort: Pulmonary effort is normal.  Abdominal:     General: Abdomen is flat.     Palpations: Abdomen is soft.     Tenderness: There is no abdominal tenderness. There is no guarding.  Musculoskeletal:        General: Normal range of motion.     Cervical back: Normal range of motion.  Skin:    General: Skin is warm.  Neurological:     General: No focal deficit present.     Mental Status: He is alert.     ED Results and Treatments Labs (all labs ordered are listed, but only abnormal results are displayed) Labs Reviewed  COMPREHENSIVE METABOLIC PANEL - Abnormal; Notable for the following components:      Result Value   Sodium 133 (*)    Potassium 6.7 (*)    Glucose, Bld 131 (*)    BUN 33 (*)    Creatinine, Ser 1.39 (*)    Calcium 7.4 (*)    Total Protein 4.9 (*)    Albumin 2.8 (*)    AST 48 (*)    Alkaline Phosphatase 21 (*)    All other components within normal limits  CBC WITH DIFFERENTIAL/PLATELET - Abnormal; Notable for the following components:   WBC 17.2 (*)    RBC 3.45 (*)    Hemoglobin 11.0 (*)    HCT 33.8 (*)    Neutro Abs 12.3 (*)    Monocytes Absolute 1.4 (*)    Basophils Absolute 0.2 (*)     Abs Immature Granulocytes 0.87 (*)    All other components within normal limits  CK - Abnormal; Notable for the following components:   Total CK 1,382 (*)    All other components within normal limits  I-STAT CG4 LACTIC ACID, ED - Abnormal; Notable for the following components:   Lactic Acid, Venous 3.5 (*)    All other components within normal limits  POC OCCULT BLOOD, ED - Abnormal; Notable for the following components:   Fecal Occult Bld POSITIVE (*)    All other components within normal limits  TROPONIN I (HIGH SENSITIVITY) - Abnormal; Notable for the following components:   Troponin I (High Sensitivity) 81 (*)    All other components within normal limits  TROPONIN I (HIGH SENSITIVITY) - Abnormal; Notable for the following components:   Troponin I (High Sensitivity) 151 (*)    All other components within normal limits  RESP PANEL BY RT-PCR (RSV, FLU A&B, COVID)  RVPGX2  CULTURE, BLOOD (ROUTINE X 2)  CULTURE, BLOOD (ROUTINE X 2)  D-DIMER, QUANTITATIVE  RAPID URINE DRUG SCREEN, HOSP PERFORMED  URINALYSIS, W/ REFLEX TO CULTURE (INFECTION SUSPECTED)  TSH  MAGNESIUM  POC OCCULT BLOOD, ED  I-STAT CG4 LACTIC ACID, ED  Radiology CT ABDOMEN PELVIS W CONTRAST Result Date: 11/04/2023 CLINICAL DATA:  Abdominal pain. EXAM: CT ABDOMEN AND PELVIS WITH CONTRAST TECHNIQUE: Multidetector CT imaging of the abdomen and pelvis was performed using the standard protocol following bolus administration of intravenous contrast. RADIATION DOSE REDUCTION: This exam was performed according to the departmental dose-optimization program which includes automated exposure control, adjustment of the mA and/or kV according to patient size and/or use of iterative reconstruction technique. CONTRAST:  75mL OMNIPAQUE IOHEXOL 350 MG/ML SOLN COMPARISON:  None Available. FINDINGS: Lower chest: The  visualized lung bases are clear. No intra-abdominal free air or free fluid. Hepatobiliary: The liver is unremarkable. No biliary dilatation. The gallbladder is unremarkable. Pancreas: Unremarkable. No pancreatic ductal dilatation or surrounding inflammatory changes. Spleen: Normal in size without focal abnormality. Adrenals/Urinary Tract: The adrenal glands unremarkable. Small left renal inferior pole cyst. There is no hydronephrosis on either side. There is symmetric enhancement and excretion of contrast by both kidneys. The visualized ureters and urinary bladder appear unremarkable. Stomach/Bowel: There is no bowel obstruction or active inflammation. The appendix is normal. Vascular/Lymphatic: The abdominal aorta and IVC are unremarkable. No portal venous gas. There is no adenopathy. Reproductive: The prostate and seminal vesicles are grossly unremarkable. No pelvic mass. Other: None Musculoskeletal: Mild diffuse subcutaneous edema. No fluid collection. No acute osseous pathology. IMPRESSION: 1. No acute intra-abdominal or pelvic pathology. 2. No bowel obstruction. Normal appendix. Electronically Signed   By: Elgie Collard M.D.   On: 11/04/2023 13:01   CT Head Wo Contrast Result Date: 11/04/2023 CLINICAL DATA:  Ataxia, head trauma EXAM: CT HEAD WITHOUT CONTRAST TECHNIQUE: Contiguous axial images were obtained from the base of the skull through the vertex without intravenous contrast. RADIATION DOSE REDUCTION: This exam was performed according to the departmental dose-optimization program which includes automated exposure control, adjustment of the mA and/or kV according to patient size and/or use of iterative reconstruction technique. COMPARISON:  MRI head July 06, 2016. FINDINGS: Brain: No evidence of acute infarction, hemorrhage, hydrocephalus, extra-axial collection or mass lesion/mass effect. Vascular: No hyperdense vessel. Skull: No acute fracture. Sinuses/Orbits: Clear sinuses.  No acute orbital  findings. Other: No mastoid effusions. IMPRESSION: No evidence of acute intracranial abnormality. Electronically Signed   By: Feliberto Harts M.D.   On: 11/04/2023 12:55   DG Chest Portable 1 View Result Date: 11/04/2023 CLINICAL DATA:  Syncope EXAM: PORTABLE CHEST 1 VIEW COMPARISON:  May 25, 2020. FINDINGS: The cardiomediastinal silhouette is unchanged in contour. No pleural effusion. No pneumothorax. No acute pleuroparenchymal abnormality. IMPRESSION: No acute cardiopulmonary abnormality. Electronically Signed   By: Meda Klinefelter M.D.   On: 11/04/2023 11:04    Pertinent labs & imaging results that were available during my care of the patient were reviewed by me and considered in my medical decision making (see MDM for details).  Medications Ordered in ED Medications  piperacillin-tazobactam (ZOSYN) IVPB 3.375 g (has no administration in time range)  calcium gluconate 1 g/ 50 mL sodium chloride IVPB (1,000 mg Intravenous New Bag/Given 11/04/23 1314)  vancomycin (VANCOREADY) IVPB 1750 mg/350 mL (has no administration in time range)  lactated ringers bolus 1,000 mL (has no administration in time range)  lactated ringers bolus 1,000 mL (1,000 mLs Intravenous New Bag/Given 11/04/23 1049)  sodium zirconium cyclosilicate (LOKELMA) packet 10 g (10 g Oral Given 11/04/23 1309)  dextrose 50 % solution 25 mL (25 mLs Intravenous Given 11/04/23 1309)  insulin aspart (novoLOG) injection 6 Units (6 Units Intravenous Given 11/04/23 1317)  iohexol (OMNIPAQUE) 350  MG/ML injection 75 mL (75 mLs Intravenous Contrast Given 11/04/23 1249)  lactated ringers bolus 1,000 mL (1,000 mLs Intravenous New Bag/Given 11/04/23 1323)                                                                                                                                     Procedures .Critical Care  Performed by: Arletha Pili, DO Authorized by: Arletha Pili, DO   Critical care provider statement:    Critical care  time (minutes):  100   Critical care was time spent personally by me on the following activities:  Development of treatment plan with patient or surrogate, discussions with consultants, evaluation of patient's response to treatment, examination of patient, ordering and review of laboratory studies, ordering and review of radiographic studies, ordering and performing treatments and interventions, pulse oximetry, re-evaluation of patient's condition and review of old charts   (including critical care time)  Medical Decision Making / ED Course   This patient presents to the ED for concern of syncope, this involves an extensive number of treatment options, and is a complaint that carries with it a high risk of complications and morbidity.  The differential diagnosis includes vasovagal syncope, cardiogenic syncope, anemia, dehydration, PE, ACS, consider GI bleed, less likely AAA, less likely dissection, viral syndrome.  MDM: CPR was not performed by a trained provider.  EKG, troponin, D-dimer ordered as the patient was initially tachycardic.  I have ordered him some IV fluids.  Abdomen soft, nontender.  Patient overall looks well.  With him being on testosterone, increased risk for embolic event.  Patient stating he is felt unwell recently, could certainly be influenza, dehydration.  Lower suspicion for GI bleed despite report of possible dark stools.  It sounds as though more it has been a viral gastroenteritis it has been affecting the patient.   Reassessment 2 PM-patient's labs certainly a bit of a surprise for me.  Patient with new anemia, leukocytosis, worsening renal function, lactic acidosis, elevated CK, hyperkalemia.  Patient also fecal occult positive, elevated troponin.  With the patient's symptoms, patient may have sepsis.  Patient has received 3 L of IV fluid.  I reassessed the patient following his IV fluid bolus.  Patient is received broad-spectrum antibiotics.  Patient has an  elevated troponin, may be related to his compressions that he received.  Patient without any chest pain, EKG does not show any evidence of ischemia.  Reached out to cardiology for evaluation, however with his anemia, occult positive stool, do not believe heparin in this particular patient is appropriate.  Patient treated for hyperkalemia.  Patient continues to look okay in the room.  This certainly could all be sepsis, could be rhabdomyolysis leading to multiorgan system injury.  CT imaging the abdomen pelvis negative.  My independent review of the patient's chest x-ray does not show any pneumonia.  Have admitted patient to hospitalist team.  Additional history  obtained: -Additional history obtained from EMS, wife at bedside -External records from outside source obtained and reviewed including: Chart review including previous notes, labs, imaging, consultation notes   Lab Tests: -I ordered, reviewed, and interpreted labs.   The pertinent results include:   Labs Reviewed  COMPREHENSIVE METABOLIC PANEL - Abnormal; Notable for the following components:      Result Value   Sodium 133 (*)    Potassium 6.7 (*)    Glucose, Bld 131 (*)    BUN 33 (*)    Creatinine, Ser 1.39 (*)    Calcium 7.4 (*)    Total Protein 4.9 (*)    Albumin 2.8 (*)    AST 48 (*)    Alkaline Phosphatase 21 (*)    All other components within normal limits  CBC WITH DIFFERENTIAL/PLATELET - Abnormal; Notable for the following components:   WBC 17.2 (*)    RBC 3.45 (*)    Hemoglobin 11.0 (*)    HCT 33.8 (*)    Neutro Abs 12.3 (*)    Monocytes Absolute 1.4 (*)    Basophils Absolute 0.2 (*)    Abs Immature Granulocytes 0.87 (*)    All other components within normal limits  CK - Abnormal; Notable for the following components:   Total CK 1,382 (*)    All other components within normal limits  I-STAT CG4 LACTIC ACID, ED - Abnormal; Notable for the following components:   Lactic Acid, Venous 3.5 (*)    All other  components within normal limits  POC OCCULT BLOOD, ED - Abnormal; Notable for the following components:   Fecal Occult Bld POSITIVE (*)    All other components within normal limits  TROPONIN I (HIGH SENSITIVITY) - Abnormal; Notable for the following components:   Troponin I (High Sensitivity) 81 (*)    All other components within normal limits  TROPONIN I (HIGH SENSITIVITY) - Abnormal; Notable for the following components:   Troponin I (High Sensitivity) 151 (*)    All other components within normal limits  RESP PANEL BY RT-PCR (RSV, FLU A&B, COVID)  RVPGX2  CULTURE, BLOOD (ROUTINE X 2)  CULTURE, BLOOD (ROUTINE X 2)  D-DIMER, QUANTITATIVE  RAPID URINE DRUG SCREEN, HOSP PERFORMED  URINALYSIS, W/ REFLEX TO CULTURE (INFECTION SUSPECTED)  TSH  MAGNESIUM  POC OCCULT BLOOD, ED  I-STAT CG4 LACTIC ACID, ED      EKG my independent review the patient's EKG shows no ST segment depressions or elevations, no T wave versions, no evidence of acute ischemia.  EKG Interpretation Date/Time:  Thursday November 04 2023 10:27:12 EST Ventricular Rate:  109 PR Interval:  140 QRS Duration:  82 QT Interval:  311 QTC Calculation: 419 R Axis:   66  Text Interpretation: Sinus tachycardia ST elev, probable normal early repol pattern Confirmed by Anders Simmonds [phone]) on 11/04/2023 2:07:58 PM         Imaging Studies ordered: I ordered imaging studies including chest x-ray, CT imaging of the abdomen pelvis I independently visualized and interpreted imaging. I agree with the radiologist interpretation   Medicines ordered and prescription drug management: Meds ordered this encounter  Medications   lactated ringers bolus 1,000 mL   piperacillin-tazobactam (ZOSYN) IVPB 3.375 g    Antibiotic Indication::   Sepsis   calcium gluconate 1 g/ 50 mL sodium chloride IVPB   sodium zirconium cyclosilicate (LOKELMA) packet 10 g   dextrose 50 % solution 25 mL   insulin aspart (novoLOG) injection 6 Units  iohexol (OMNIPAQUE) 350 MG/ML injection 75 mL   lactated ringers bolus 1,000 mL   vancomycin (VANCOREADY) IVPB 1750 mg/350 mL    Indication::   Bacteremia   lactated ringers bolus 1,000 mL    -I have reviewed the patients home medicines and have made adjustments as needed  Critical interventions Sepsis, hyperkalemia   Cardiac Monitoring: The patient was maintained on a cardiac monitor.  I personally viewed and interpreted the cardiac monitored which showed an underlying rhythm of: Normal sinus rhythm  Social Determinants of Health:  Factors impacting patients care include:    Reevaluation: After the interventions noted above, I reevaluated the patient and found that they have :improved  Co morbidities that complicate the patient evaluation  Past Medical History:  Diagnosis Date   Allergies    Anxiety    Chest pain    Environmental allergies    Genital herpes    High cholesterol    Hyperlipemia    Low testosterone    Myalgia    Pituitary cyst (HCC)    Premature ejaculation    Tinea versicolor       Dispostion: Admission     Final Clinical Impression(s) / ED Diagnoses Final diagnoses:  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

## 2023-11-04 NOTE — Progress Notes (Signed)
 ED Pharmacy Antibiotic Sign Off An antibiotic consult was received from an ED provider for vancomycin per pharmacy dosing for bacteremia. A chart review was completed to assess appropriateness.  A single dose of zosyn 3.375 mg was placed by the ED provider.   The following one time order(s) were placed per pharmacy consult:  vancomycin 1750 mg x 1 dose   Further antibiotic and/or antibiotic pharmacy consults should be ordered by the admitting provider if indicated.   Thank you for allowing pharmacy to be a part of this patient's care.   Ernestene Kiel, PharmD PGY1 Pharmacy Resident  Please check AMION for all [facility] Pharmacy phone numbers After 10:00 PM, call Main Pharmacy [phone]

## 2023-11-05 ENCOUNTER — Inpatient Hospital Stay (HOSPITAL_COMMUNITY): Payer: Commercial Managed Care - PPO

## 2023-11-05 ENCOUNTER — Encounter (HOSPITAL_COMMUNITY)
Admission: EM | Disposition: A | Discharge status: Home / Self Care | Payer: Self-pay | Source: Home / Self Care | Attending: Internal Medicine

## 2023-11-05 ENCOUNTER — Encounter (HOSPITAL_COMMUNITY): Payer: Self-pay | Admitting: Internal Medicine

## 2023-11-05 DIAGNOSIS — R569 Unspecified convulsions: Secondary | ICD-10-CM

## 2023-11-05 DIAGNOSIS — R7989 Other specified abnormal findings of blood chemistry: Secondary | ICD-10-CM | POA: Diagnosis not present

## 2023-11-05 DIAGNOSIS — R55 Syncope and collapse: Secondary | ICD-10-CM

## 2023-11-05 DIAGNOSIS — K921 Melena: Secondary | ICD-10-CM

## 2023-11-05 DIAGNOSIS — K922 Gastrointestinal hemorrhage, unspecified: Secondary | ICD-10-CM | POA: Diagnosis not present

## 2023-11-05 HISTORY — PX: BIOPSY: SHX5522

## 2023-11-05 HISTORY — PX: ESOPHAGOGASTRODUODENOSCOPY (EGD) WITH PROPOFOL: SHX5813

## 2023-11-05 LAB — COMPREHENSIVE METABOLIC PANEL
ALT: 30 U/L (ref 0–44)
AST: 31 U/L (ref 15–41)
Albumin: 2.3 g/dL — ABNORMAL LOW (ref 3.5–5.0)
Alkaline Phosphatase: 17 U/L — ABNORMAL LOW (ref 38–126)
Anion gap: 10 (ref 5–15)
BUN: 25 mg/dL — ABNORMAL HIGH (ref 6–20)
CO2: 29 mmol/L (ref 22–32)
Calcium: 7.6 mg/dL — ABNORMAL LOW (ref 8.9–10.3)
Chloride: 97 mmol/L — ABNORMAL LOW (ref 98–111)
Creatinine, Ser: 1.1 mg/dL (ref 0.61–1.24)
GFR, Estimated: >60 mL/min (ref >60)
Glucose, Bld: 91 mg/dL (ref 70–99)
Potassium: 3.8 mmol/L (ref 3.5–5.1)
Sodium: 136 mmol/L (ref 135–145)
Total Bilirubin: 0.6 mg/dL (ref 0.0–1.2)
Total Protein: 4.1 g/dL — ABNORMAL LOW (ref 6.5–8.1)

## 2023-11-05 LAB — CBC
HCT: 23.9 % — ABNORMAL LOW (ref 39.0–52.0)
HCT: 21.8 % — ABNORMAL LOW (ref 39.0–52.0)
HCT: 22.5 % — ABNORMAL LOW (ref 39.0–52.0)
Hemoglobin: 7.9 g/dL — ABNORMAL LOW (ref 13.0–17.0)
Hemoglobin: 7.2 g/dL — ABNORMAL LOW (ref 13.0–17.0)
Hemoglobin: 7.3 g/dL — ABNORMAL LOW (ref 13.0–17.0)
MCH: 31.5 pg (ref 26.0–34.0)
MCH: 31.9 pg (ref 26.0–34.0)
MCH: 31.1 pg (ref 26.0–34.0)
MCHC: 33.1 g/dL (ref 30.0–36.0)
MCHC: 33 g/dL (ref 30.0–36.0)
MCHC: 32.4 g/dL (ref 30.0–36.0)
MCV: 95.2 fL (ref 80.0–100.0)
MCV: 96.5 fL (ref 80.0–100.0)
MCV: 95.7 fL (ref 80.0–100.0)
Platelets: 192 10*3/uL (ref 150–400)
Platelets: 185 10*3/uL (ref 150–400)
Platelets: 194 10*3/uL (ref 150–400)
RBC: 2.51 MIL/uL — ABNORMAL LOW (ref 4.22–5.81)
RBC: 2.26 MIL/uL — ABNORMAL LOW (ref 4.22–5.81)
RBC: 2.35 MIL/uL — ABNORMAL LOW (ref 4.22–5.81)
RDW: 13.8 % (ref 11.5–15.5)
RDW: 13.9 % (ref 11.5–15.5)
RDW: 14 % (ref 11.5–15.5)
WBC: 13.9 10*3/uL — ABNORMAL HIGH (ref 4.0–10.5)
WBC: 8.4 10*3/uL (ref 4.0–10.5)
WBC: 10.1 10*3/uL (ref 4.0–10.5)
nRBC: 0.4 % — ABNORMAL HIGH (ref 0.0–0.2)
nRBC: 0.7 % — ABNORMAL HIGH (ref 0.0–0.2)
nRBC: 1.1 % — ABNORMAL HIGH (ref 0.0–0.2)

## 2023-11-05 LAB — ABO/RH: ABO/RH(D): B POS

## 2023-11-05 LAB — ECHOCARDIOGRAM COMPLETE
Area-P 1/2: 4.35 cm2
Calc EF: 47.6 %
Height: 67 in
S' Lateral: 3.4 cm
Single Plane A2C EF: 46.1 %
Single Plane A4C EF: 53.1 %
Weight: 3081.61 [oz_av]

## 2023-11-05 LAB — LACTIC ACID, PLASMA: Lactic Acid, Venous: 1.9 mmol/L (ref 0.5–1.9)

## 2023-11-05 LAB — TYPE AND SCREEN
ABO/RH(D): B POS
Antibody Screen: NEGATIVE

## 2023-11-05 LAB — TROPONIN I (HIGH SENSITIVITY)
Troponin I (High Sensitivity): 71 ng/L — ABNORMAL HIGH (ref <18)
Troponin I (High Sensitivity): 55 ng/L — ABNORMAL HIGH (ref <18)

## 2023-11-05 LAB — CK: Total CK: 758 U/L — ABNORMAL HIGH (ref 49–397)

## 2023-11-05 SURGERY — ESOPHAGOGASTRODUODENOSCOPY (EGD) WITH PROPOFOL
Anesthesia: Monitor Anesthesia Care

## 2023-11-05 MED ORDER — LIDOCAINE 2% (20 MG/ML) 5 ML SYRINGE
INTRAMUSCULAR | Status: DC | PRN
  Administered 2023-11-05: 100 mg via INTRAVENOUS

## 2023-11-05 MED ORDER — PANTOPRAZOLE SODIUM 40 MG PO TBEC
40.0000 mg | DELAYED_RELEASE_TABLET | Freq: Every day | ORAL | Status: DC
  Administered 2023-11-05 – 2023-11-09 (×5): 40 mg via ORAL
  Filled 2023-11-05 (×5): qty 1

## 2023-11-05 MED ORDER — PANTOPRAZOLE SODIUM 40 MG IV SOLR: 40.0000 mg | INTRAVENOUS | Status: DC

## 2023-11-05 MED ORDER — PROPOFOL 500 MG/50ML IV EMUL
INTRAVENOUS | Status: DC | PRN
  Administered 2023-11-05: 130 ug/kg/min via INTRAVENOUS

## 2023-11-05 MED ORDER — SODIUM CHLORIDE 0.9 % IV SOLN: INTRAVENOUS | Status: DC | PRN

## 2023-11-05 MED ORDER — PROPOFOL 10 MG/ML IV BOLUS
INTRAVENOUS | Status: DC | PRN
  Administered 2023-11-05: 40 mg via INTRAVENOUS
  Administered 2023-11-05: 80 mg via INTRAVENOUS

## 2023-11-05 MED ORDER — SODIUM CHLORIDE 0.9 % IV SOLN: INTRAVENOUS | Status: DC

## 2023-11-05 SURGICAL SUPPLY — 14 items

## 2023-11-05 NOTE — H&P (Signed)
 Robert Sanford is an 38 y.o. male.   Chief Complaint: Black stools HPI: 38 year old male was admitted yesterday after he was found unconscious on the toilet floor with his wife requiring CPR. Patient states he was in his usual state of health until 2 days  ago when he felt fatigue, with some weakness and he thought it was related to him over exercising. He has been taking ibuprofen 800 mg every other day for the last 2 weeks, denies additional NSAID use, use of Goody powders aspirin. He has also noticed intermittent dark/black stools for the last several days but did not think much of it as the stools would go back to normal color. He denies noticing fresh blood in stool. He denies nausea, vomiting, but has been experiencing some indigestion without heartburn acid reflux. He denies difficulty swallowing, pain on swallowing. He denies unintentional weight loss but has had mild bloating and slightly decreased appetite. No prior EGD or colonoscopy. He states his son required an EGD and colonoscopy but does not have a specific GI diagnosis. He does not smoke but occasionally vapes nicotine. Denies use of alcohol or recreational drug use. He works as a Financial planner. On admission he was found to be tachycardic with elevated lactic acid and FOBT positive stool.  Past Medical History:  Diagnosis Date   Allergies    Anxiety    Chest pain    Environmental allergies    Genital herpes    High cholesterol    Hyperlipemia    Low testosterone    Myalgia    Pituitary cyst (HCC)    Premature ejaculation    Tinea versicolor     History reviewed. No pertinent surgical history.  Family History  Problem Relation Age of Onset   Stroke Father    Social History:  reports that he quit smoking about 16 years ago. His smoking use included cigarettes. He has never used smokeless tobacco. He reports current alcohol use. He reports that he does not currently use drugs.  Allergies: No Known  Allergies  Medications Prior to Admission  Medication Sig Dispense Refill   ibuprofen (ADVIL) 800 MG tablet Take 800 mg by mouth daily as needed for mild pain (pain score 1-3) or moderate pain (pain score 4-6).     testosterone cypionate (DEPOTESTOSTERONE CYPIONATE) 200 MG/ML injection Inject 200 mg into the muscle every 14 (fourteen) days.     sertraline (ZOLOFT) 50 MG tablet Take 50 mg by mouth daily. (Patient not taking: Reported on 11/04/2023)      Results for orders placed or performed during the hospital encounter of 11/04/23 (from the past 48 hours)  Resp panel by RT-PCR (RSV, Flu A&B, Covid) Anterior Nasal Swab     Status: None   Collection Time: 11/04/23 10:30 AM   Specimen: Anterior Nasal Swab  Result Value Ref Range   SARS Coronavirus 2 by RT PCR NEGATIVE NEGATIVE   Influenza A by PCR NEGATIVE NEGATIVE   Influenza B by PCR NEGATIVE NEGATIVE    Comment: (NOTE) The Xpert Xpress SARS-CoV-2/FLU/RSV plus assay is intended as an aid in the diagnosis of influenza from Nasopharyngeal swab specimens and should not be used as a sole basis for treatment. Nasal washings and aspirates are unacceptable for Xpert Xpress SARS-CoV-2/FLU/RSV testing.  Fact Sheet for Patients: BloggerCourse.com  Fact Sheet for Healthcare Providers: SeriousBroker.it  This test is not yet approved or cleared by the [place] FDA and has been authorized for detection and/or diagnosis of SARS-CoV-2 by FDA  under an Emergency Use Authorization (EUA). This EUA will remain in effect (meaning this test can be used) for the duration of the COVID-19 declaration under Section 564(b)(1) of the Act, 21 U.S.C. section 360bbb-3(b)(1), unless the authorization is terminated or revoked.     Resp Syncytial Virus by PCR NEGATIVE NEGATIVE    Comment: (NOTE) Fact Sheet for Patients: BloggerCourse.com  Fact Sheet for Healthcare  Providers: SeriousBroker.it  This test is not yet approved or cleared by the [place] FDA and has been authorized for detection and/or diagnosis of SARS-CoV-2 by FDA under an Emergency Use Authorization (EUA). This EUA will remain in effect (meaning this test can be used) for the duration of the COVID-19 declaration under Section 564(b)(1) of the Act, 21 U.S.C. section 360bbb-3(b)(1), unless the authorization is terminated or revoked.  Performed at [facility], [address], [place], [place] [address]   Comprehensive metabolic panel     Status: Abnormal   Collection Time: 11/04/23 10:38 AM  Result Value Ref Range   Sodium 133 (L) 135 - 145 mmol/L   Potassium 6.7 (HH) 3.5 - 5.1 mmol/L    Comment: CRITICAL RESULT CALLED TO, READ BACK BY AND VERIFIED WITH Merlinda Frederick, RN [id number] 11/04/23 L. KLAR   Chloride 99 98 - 111 mmol/L   CO2 27 22 - 32 mmol/L   Glucose, Bld 131 (H) 70 - 99 mg/dL    Comment: Glucose reference range applies only to samples taken after fasting for at least 8 hours.   BUN 33 (H) 6 - 20 mg/dL   Creatinine, Ser 1.91 (H) 0.61 - 1.24 mg/dL   Calcium 7.4 (L) 8.9 - 10.3 mg/dL   Total Protein 4.9 (L) 6.5 - 8.1 g/dL   Albumin 2.8 (L) 3.5 - 5.0 g/dL   AST 48 (H) 15 - 41 U/L   ALT 42 0 - 44 U/L   Alkaline Phosphatase 21 (L) 38 - 126 U/L   Total Bilirubin 0.7 0.0 - 1.2 mg/dL   GFR, Estimated >47 >82 mL/min    Comment: (NOTE) Calculated using the CKD-EPI Creatinine Equation (2021)    Anion gap 7 5 - 15    Comment: Performed at [facility], [address] [place], [place] [address]  CBC with Differential     Status: Abnormal   Collection Time: 11/04/23 10:38 AM  Result Value Ref Range   WBC 17.2 (H) 4.0 - 10.5 K/uL   RBC 3.45 (L) 4.22 - 5.81 MIL/uL   Hemoglobin 11.0 (L) 13.0 - 17.0 g/dL   HCT 30.8 (L) 65.7 - 84.6 %   MCV 98.0 80.0 - 100.0 fL   MCH 31.9 26.0 - 34.0 pg   MCHC 32.5 30.0 - 36.0 g/dL   RDW 96.2 95.2 - 84.1 %    Platelets 242 150 - 400 K/uL   nRBC 0.1 0.0 - 0.2 %   Neutrophils Relative % 71 %   Neutro Abs 12.3 (H) 1.7 - 7.7 K/uL   Lymphocytes Relative 14 %   Lymphs Abs 2.4 0.7 - 4.0 K/uL   Monocytes Relative 8 %   Monocytes Absolute 1.4 (H) 0.1 - 1.0 K/uL   Eosinophils Relative 1 %   Eosinophils Absolute 0.1 0.0 - 0.5 K/uL   Basophils Relative 1 %   Basophils Absolute 0.2 (H) 0.0 - 0.1 K/uL   Immature Granulocytes 5 %   Abs Immature Granulocytes 0.87 (H) 0.00 - 0.07 K/uL    Comment: Performed at [facility]  Lab, [address], [place], [place] [address]  Troponin I (High Sensitivity)     Status: Abnormal   Collection Time: 11/04/23 10:38 AM  Result Value Ref Range   Troponin I (High Sensitivity) 81 (H) <18 ng/L    Comment: (NOTE) Elevated high sensitivity troponin I (hsTnI) values and significant  changes across serial measurements may suggest ACS but many other  chronic and acute conditions are known to elevate hsTnI results.  Refer to the "Links" section for chest pain algorithms and additional  guidance. Performed at [facility], [address] [place], [place] [address]   D-dimer, quantitative     Status: None   Collection Time: 11/04/23 10:38 AM  Result Value Ref Range   D-Dimer, Quant <0.27 0.00 - 0.50 ug/mL-FEU    Comment: (NOTE) At the manufacturer cut-off value of 0.5 g/mL FEU, this assay has a negative predictive value of 95-100%.This assay is intended for use in conjunction with a clinical pretest probability (PTP) assessment model to exclude pulmonary embolism (PE) and deep venous thrombosis (DVT) in outpatients suspected of PE or DVT. Results should be correlated with clinical presentation. Performed at [facility], [address], [place], [place] [address]   TSH     Status: None   Collection Time: 11/04/23 12:34 PM  Result Value Ref Range   TSH 1.855 0.350 - 4.500 uIU/mL    Comment: Performed by a 3rd Generation assay with a functional  sensitivity of <=0.01 uIU/mL. Performed at [facility], [address] [place], [place] [address]   Troponin I (High Sensitivity)     Status: Abnormal   Collection Time: 11/04/23 12:34 PM  Result Value Ref Range   Troponin I (High Sensitivity) 151 (HH) <18 ng/L    Comment: CRITICAL RESULT CALLED TO, READ BACK BY AND VERIFIED WITH Merlinda Frederick, RN [phone] 11/04/23 L. KLAR (NOTE) Elevated high sensitivity troponin I (hsTnI) values and significant  changes across serial measurements may suggest ACS but many other  chronic and acute conditions are known to elevate hsTnI results.  Refer to the "Links" section for chest pain algorithms and additional  guidance. Performed at [facility], [address] [place], [place] [address]   CK     Status: Abnormal   Collection Time: 11/04/23 12:34 PM  Result Value Ref Range   Total CK 1,382 (H) 49 - 397 U/L    Comment: Performed at [facility], [address] [place], [place] [address]  I-Stat CG4 Lactic Acid     Status: Abnormal   Collection Time: 11/04/23 12:45 PM  Result Value Ref Range   Lactic Acid, Venous 3.5 (HH) 0.5 - 1.9 mmol/L   Comment NOTIFIED PHYSICIAN   Rapid urine drug screen (hospital performed)     Status: None   Collection Time: 11/04/23  1:23 PM  Result Value Ref Range   Opiates NONE DETECTED NONE DETECTED   Cocaine NONE DETECTED NONE DETECTED   Benzodiazepines NONE DETECTED NONE DETECTED   Amphetamines NONE DETECTED NONE DETECTED   Tetrahydrocannabinol NONE DETECTED NONE DETECTED   Barbiturates NONE DETECTED NONE DETECTED    Comment: (NOTE) DRUG SCREEN FOR MEDICAL PURPOSES ONLY.  IF CONFIRMATION IS NEEDED FOR ANY PURPOSE, NOTIFY LAB WITHIN 5 DAYS.  LOWEST DETECTABLE LIMITS FOR URINE DRUG SCREEN Drug Class                     Cutoff (ng/mL) Amphetamine and metabolites  1000 Barbiturate and metabolites    200 Benzodiazepine                 200 Opiates and metabolites        300 Cocaine and  metabolites        300 THC                            50 Performed at [facility], [address], [place], [place] [address]   Urinalysis, w/ Reflex to Culture (Infection Suspected) -Urine, Clean Catch     Status: None   Collection Time: 11/04/23  1:23 PM  Result Value Ref Range   Specimen Source URINE, CLEAN CATCH    Color, Urine YELLOW YELLOW   APPearance CLEAR CLEAR   Specific Gravity, Urine 1.028 1.005 - 1.030   pH 7.0 5.0 - 8.0   Glucose, UA NEGATIVE NEGATIVE mg/dL   Hgb urine dipstick NEGATIVE NEGATIVE   Bilirubin Urine NEGATIVE NEGATIVE   Ketones, ur NEGATIVE NEGATIVE mg/dL   Protein, ur NEGATIVE NEGATIVE mg/dL   Nitrite NEGATIVE NEGATIVE   Leukocytes,Ua NEGATIVE NEGATIVE   RBC / HPF 0-5 0 - 5 RBC/hpf   WBC, UA 0-5 0 - 5 WBC/hpf    Comment:        Reflex urine culture not performed if WBC <=10, OR if Squamous epithelial cells >5. If Squamous epithelial cells >5 suggest recollection.    Bacteria, UA NONE SEEN NONE SEEN   Squamous Epithelial / HPF 0-5 0 - 5 /HPF    Comment: Performed at [facility], [address] [place], [place] [address]  POC occult blood, ED     Status: Abnormal   Collection Time: 11/04/23  1:33 PM  Result Value Ref Range   Fecal Occult Bld POSITIVE (A) NEGATIVE  HIV Antibody (routine testing w rflx)     Status: None   Collection Time: 11/04/23  3:39 PM  Result Value Ref Range   HIV Screen 4th Generation wRfx Non Reactive Non Reactive    Comment: Performed at [facility], [address], [place], [place] [address]  Basic metabolic panel     Status: Abnormal   Collection Time: 11/04/23  3:39 PM  Result Value Ref Range   Sodium 135 135 - 145 mmol/L   Potassium 5.1 3.5 - 5.1 mmol/L   Chloride 98 98 - 111 mmol/L   CO2 29 22 - 32 mmol/L   Glucose, Bld 93 70 - 99 mg/dL    Comment: Glucose reference range applies only to samples taken after fasting for at least 8 hours.   BUN 29 (H) 6 - 20 mg/dL   Creatinine, Ser 2.13 0.61 -  1.24 mg/dL   Calcium 8.0 (L) 8.9 - 10.3 mg/dL   GFR, Estimated >08 >65 mL/min    Comment: (NOTE) Calculated using the CKD-EPI Creatinine Equation (2021)    Anion gap 8 5 - 15    Comment: Performed at [facility], [address], [place], [place] [address]  I-Stat CG4 Lactic Acid     Status: Abnormal   Collection Time: 11/04/23  3:53 PM  Result Value Ref Range   Lactic Acid, Venous 2.4 (HH) 0.5 - 1.9 mmol/L   Comment NOTIFIED PHYSICIAN   Lactic acid, plasma     Status: Abnormal   Collection Time: 11/04/23  7:57 PM  Result Value Ref Range   Lactic Acid, Venous  2.0 (HH) 0.5 - 1.9 mmol/L    Comment: CRITICAL RESULT CALLED TO, READ BACK BY AND VERIFIED WITH A GOINS,RN 2045 11/04/2023 WBOND Performed at [facility], [address] [place], [place] [address]   Magnesium     Status: None   Collection Time: 11/04/23 10:07 PM  Result Value Ref Range   Magnesium 1.7 1.7 - 2.4 mg/dL    Comment: Performed at [facility], [address] [place], [place] [address]  Lactic acid, plasma     Status: Abnormal   Collection Time: 11/04/23 10:07 PM  Result Value Ref Range   Lactic Acid, Venous 2.1 (HH) 0.5 - 1.9 mmol/L    Comment: CRITICAL VALUE NOTED. VALUE IS CONSISTENT WITH PREVIOUSLY REPORTED/CALLED VALUE Performed at [facility], [address], [place], [place] [address]   Comprehensive metabolic panel     Status: Abnormal   Collection Time: 11/05/23  2:14 AM  Result Value Ref Range   Sodium 136 135 - 145 mmol/L   Potassium 3.8 3.5 - 5.1 mmol/L   Chloride 97 (L) 98 - 111 mmol/L   CO2 29 22 - 32 mmol/L   Glucose, Bld 91 70 - 99 mg/dL    Comment: Glucose reference range applies only to samples taken after fasting for at least 8 hours.   BUN 25 (H) 6 - 20 mg/dL   Creatinine, Ser 9.14 0.61 - 1.24 mg/dL   Calcium 7.6 (L) 8.9 - 10.3 mg/dL   Total Protein 4.1 (L) 6.5 - 8.1 g/dL   Albumin 2.3 (L) 3.5 - 5.0 g/dL   AST 31 15 - 41 U/L   ALT 30 0 - 44 U/L   Alkaline  Phosphatase 17 (L) 38 - 126 U/L   Total Bilirubin 0.6 0.0 - 1.2 mg/dL   GFR, Estimated >78 >29 mL/min    Comment: (NOTE) Calculated using the CKD-EPI Creatinine Equation (2021)    Anion gap 10 5 - 15    Comment: Performed at [facility] Lab, [address], [place], [place] [address]  CBC     Status: Abnormal   Collection Time: 11/05/23  2:14 AM  Result Value Ref Range   WBC 13.9 (H) 4.0 - 10.5 K/uL   RBC 2.51 (L) 4.22 - 5.81 MIL/uL   Hemoglobin 7.9 (L) 13.0 - 17.0 g/dL    Comment: REPEATED TO VERIFY   HCT 23.9 (L) 39.0 - 52.0 %   MCV 95.2 80.0 - 100.0 fL   MCH 31.5 26.0 - 34.0 pg   MCHC 33.1 30.0 - 36.0 g/dL   RDW 08.6 57.8 - 46.9 %   Platelets 192 150 - 400 K/uL   nRBC 0.4 (H) 0.0 - 0.2 %    Comment: Performed at [facility] Lab, [address] [place], [place] [address]  CK     Status: Abnormal   Collection Time: 11/05/23  2:14 AM  Result Value Ref Range   Total CK 758 (H) 49 - 397 U/L    Comment: Performed at [facility], [address], [place], [place] [address]   CT ABDOMEN PELVIS W CONTRAST Result Date: 11/04/2023 CLINICAL DATA:  Abdominal pain. EXAM: CT ABDOMEN AND PELVIS WITH CONTRAST TECHNIQUE: Multidetector CT imaging of the abdomen and pelvis was performed using the standard protocol following bolus administration of intravenous contrast. RADIATION DOSE REDUCTION: This exam was performed according to the departmental dose-optimization program which includes automated exposure control, adjustment of the mA and/or  kV according to patient size and/or use of iterative reconstruction technique. CONTRAST:  75mL OMNIPAQUE IOHEXOL 350 MG/ML SOLN COMPARISON:  None Available. FINDINGS: Lower chest: The visualized lung bases are clear. No intra-abdominal free air or free fluid. Hepatobiliary: The liver is unremarkable. No biliary dilatation. The gallbladder is unremarkable. Pancreas: Unremarkable. No pancreatic ductal dilatation or surrounding inflammatory changes.  Spleen: Normal in size without focal abnormality. Adrenals/Urinary Tract: The adrenal glands unremarkable. Small left renal inferior pole cyst. There is no hydronephrosis on either side. There is symmetric enhancement and excretion of contrast by both kidneys. The visualized ureters and urinary bladder appear unremarkable. Stomach/Bowel: There is no bowel obstruction or active inflammation. The appendix is normal. Vascular/Lymphatic: The abdominal aorta and IVC are unremarkable. No portal venous gas. There is no adenopathy. Reproductive: The prostate and seminal vesicles are grossly unremarkable. No pelvic mass. Other: None Musculoskeletal: Mild diffuse subcutaneous edema. No fluid collection. No acute osseous pathology. IMPRESSION: 1. No acute intra-abdominal or pelvic pathology. 2. No bowel obstruction. Normal appendix. Electronically Signed   By: Elgie Collard M.D.   On: 11/04/2023 13:01   CT Head Wo Contrast Result Date: 11/04/2023 CLINICAL DATA:  Ataxia, head trauma EXAM: CT HEAD WITHOUT CONTRAST TECHNIQUE: Contiguous axial images were obtained from the base of the skull through the vertex without intravenous contrast. RADIATION DOSE REDUCTION: This exam was performed according to the departmental dose-optimization program which includes automated exposure control, adjustment of the mA and/or kV according to patient size and/or use of iterative reconstruction technique. COMPARISON:  MRI head July 06, 2016. FINDINGS: Brain: No evidence of acute infarction, hemorrhage, hydrocephalus, extra-axial collection or mass lesion/mass effect. Vascular: No hyperdense vessel. Skull: No acute fracture. Sinuses/Orbits: Clear sinuses.  No acute orbital findings. Other: No mastoid effusions. IMPRESSION: No evidence of acute intracranial abnormality. Electronically Signed   By: Feliberto Harts M.D.   On: 11/04/2023 12:55   DG Chest Portable 1 View Result Date: 11/04/2023 CLINICAL DATA:  Syncope EXAM: PORTABLE CHEST  1 VIEW COMPARISON:  May 25, 2020. FINDINGS: The cardiomediastinal silhouette is unchanged in contour. No pleural effusion. No pneumothorax. No acute pleuroparenchymal abnormality. IMPRESSION: No acute cardiopulmonary abnormality. Electronically Signed   By: Meda Klinefelter M.D.   On: 11/04/2023 11:04    Review of Systems Positive for loss of consciousness, mild bloating, otherwise unremarkable.  Blood pressure 122/63, pulse 94, temperature 98.1 F (36.7 C), temperature source Tympanic, resp. rate 13, height 5\' 7"  (1.702 m), weight 87.4 kg, SpO2 98%.  Physical Exam  General: Alert, awake, oriented x 3 Lungs: Equal air entry, able to speak in full sentences Heart: Regular rate rhythm Abdomen: Soft, nondistended, nontender Extremities: No deformity, no edema  Assessment/Plan Intermittent dark stools, FOBT positive stool Hemoglobin 11 on presentation and is now 7.9 without further evidence of black stools Elevated BUN/creatinine ratio of 33/1.39 on admission and 25/1.1 now  Slightly elevated WBC 17.2/13.9 Hyperkalemia on admission 6.7 resolved to 3.8 Lactic acidosis 3.5/2 point 4/2/2 0.1 Elevated troponin I 151, elevated CK total 1382/758   Syncope U tox negative CT scan of abdomen and pelvis with contrast did not show any acute intra-abdominal pelvic pathology, no bowel obstruction CT head without contrast did not show any acute intracranial abnormality   Plan diagnostic EGD now. The risks and the benefits of the procedure were discussed with the patient in details. He understands and verbalizes consent.  Kerin Salen, MD 11/05/2023, 7:38 AM

## 2023-11-05 NOTE — Progress Notes (Signed)
 Subjective:  Robert Sanford is a 38 year old male with a past medical history of low testosterone on TRT who presented to the emergency department this morning after his wife found him on the toilet not breathing s/p home CPR. He is currently being evaluated for acute GI bleed.   Today, he reports felling ok. He denies any BM overnight. The fatigue has resolved and he is feeling better than yesterday. He was taking ibuprofen for sciatica pain, 800 mg every other day. He denies feeling lightheaded or SOB during workouts when he is heavy lifting. Patient reports a heart murmur that has been present since a child that has not caused "problems".   I spoke on the phone with the wife who reported that she didn't see his "neck pulsating" and that's when she started CPR, but did not palpate for a pulse. She does not remember if he was confused when he woke up but he was able to speak coherently. Per EMS run sheet, patient was alert and oriented to place, time, person, and event.   Objective:  Vital signs in last 24 hours: Vitals:   11/05/23 0815 11/05/23 0830 11/05/23 0857 11/05/23 1134  BP: (!) 96/49 115/61 116/60 106/63  Pulse: 93 82 87 100  Resp: 13 11 14    Temp:  98.5 F (36.9 C) 98.6 F (37 C) 98.2 F (36.8 C)  TempSrc:   Oral Oral  SpO2: 100% 100% 100%   Weight:      Height:       Physical Exam: General:NAD, resting in bed  Cardiac:RRR, soft systolic murmur present Pulmonary:normal effort on room air  Abdominal:soft, non-tender  Neuro:awake, alert, participating in conversation  MSK:no pitting edema appreciated  Skin:warm and dry  Psych:  normal mood and affect      Latest Ref Rng & Units 11/05/2023    2:14 AM 11/04/2023   10:38 AM 05/25/2020   11:27 PM  CBC  WBC 4.0 - 10.5 K/uL 13.9  17.2  8.1   Hemoglobin 13.0 - 17.0 g/dL 7.9  16.1  09.6   Hematocrit 39.0 - 52.0 % 23.9  33.8  44.3   Platelets 150 - 400 K/uL 192  242  182        Latest Ref Rng & Units 11/05/2023    2:14  AM 11/04/2023    3:39 PM 11/04/2023   10:38 AM  BMP  Glucose 70 - 99 mg/dL 91  93  045   BUN 6 - 20 mg/dL 25  29  33   Creatinine 0.61 - 1.24 mg/dL 4.09  8.11  9.14   Sodium 135 - 145 mmol/L 136  135  133   Potassium 3.5 - 5.1 mmol/L 3.8  5.1  6.7   Chloride 98 - 111 mmol/L 97  98  99   CO2 22 - 32 mmol/L 29  29  27    Calcium 8.9 - 10.3 mg/dL 7.6  8.0  7.4      Assessment/Plan:  Principal Problem:   Acute GI bleeding Active Problems:   Syncope   Hyperkalemia   Elevated CK  Acute GI bleed Symptomatic anemia  Patient reports history of melena for a few days with symptomatic anemia. He is hemodynamically stable with a hemoglobin of 7.9 which is down from 11. GI was consulted and performed an EGD which did not show a source of bleeding. GI does not plan to pursue additional testing at this time. Patient's drop in hemoglobin may be due to  hemodilution or a bleed present in the small intestine.   Plan: -Repeat CBC this afternoon, if Hgb decreases, would consider talking with GI for possible capsule endoscopy to evaluate the small intestine for bleeding source  -Trend CBC, transfuse for Hgb <7 -Regular diet placed  -Oral PPI, 40mg     Syncope Patient most likely vasovagaled while having a BM on the toilet which caused him to synopsize. Vasovagal syncope is further supported by the reported prodrome symptoms of lightheadedness and nausea. Although patient's wife reported convulsion and urinary incontinence, I have a lower suspicion of seizures and a spot EEG was negative during the time we obtained it. An echocardiogram did not reveal HCOM or valvular disease.  Cardiology is consulted and plans on obtaining a coronary CTA. Plan: -Orthostatic vital signs  -Cardiology consulted, appreciate their recommendations  -Coronary CTA     Resolved Problems:  Hyperkalemia Lactic acidosis  Elevated troponin  Elevated CK _________________________________  Code Status: Full VTE Prophylaxis:SCDs   Diet:regular  IVF:N/A Barriers to Discharge: Dispo: Anticipated discharge in approximately 2 day(s).   Faith Rogue, DO 11/05/2023, 2:29 PM Pager: [phone] After 5pm on weekdays and 1pm on weekends: On Call pager [phone]

## 2023-11-05 NOTE — Op Note (Signed)
 [facility] Patient Name: Robert Sanford Procedure Date : 11/05/2023 MRN: [id number] Attending MD: Kerin Salen , MD, [id number] Date of Birth: June 19, 1986 CSN: [id number] Age: 38 Admit Type: Inpatient Procedure:                Upper GI endoscopy Indications:              Occult blood in stool, intermittent dark/black                            stools Providers:                Kerin Salen, MD, Stephens Shire RN, RN, Salley Scarlet, Technician Referring MD:             Internal Medicine Residenct Team Medicines:                Monitored Anesthesia Care Complications:            No immediate complications. Estimated blood loss:                            Minimal. Estimated Blood Loss:     Estimated blood loss was minimal. Procedure:                Pre-Anesthesia Assessment:                           - Prior to the procedure, a History and Physical                            was performed, and patient medications and                            allergies were reviewed. The patient's tolerance of                            previous anesthesia was also reviewed. The risks                            and benefits of the procedure and the sedation                            options and risks were discussed with the patient.                            All questions were answered, and informed consent                            was obtained. Prior Anticoagulants: The patient has                            taken no anticoagulant or antiplatelet agents. ASA  Grade Assessment: II - A patient with mild systemic                            disease. After reviewing the risks and benefits,                            the patient was deemed in satisfactory condition to                            undergo the procedure.                           After obtaining informed consent, the endoscope was                            passed under direct  vision. Throughout the                            procedure, the patient's blood pressure, pulse, and                            oxygen saturations were monitored continuously. The                            [id number]-H190 ([id number]) Olympus endoscope was introduced                            through the mouth, and advanced to the second part                            of duodenum. The upper GI endoscopy was                            accomplished without difficulty. The patient                            tolerated the procedure well. Scope In: Scope Out: Findings:      The upper third of the esophagus, middle third of the esophagus and       lower third of the esophagus were normal.      A single 6 mm nodule with a localized distribution was found at the       gastroesophageal junction. Biopsies were taken with a cold forceps for       histology.      Localized mildly erythematous mucosa without bleeding was found in the       gastric antrum. Biopsies were taken with a cold forceps for Helicobacter       pylori testing.      The cardia and gastric fundus were normal on retroflexion.      The examined duodenum was normal. Impression:               - Normal upper third of esophagus, middle third of                            esophagus  and lower third of esophagus.                           - Nodule found in the esophagus. Biopsied.                           - Erythematous mucosa in the antrum. Biopsied.                           - Normal examined duodenum. Moderate Sedation:      Patient did not receive moderate sedation for this procedure, but       instead received monitored anesthesia care. Recommendation:           - Resume regular diet.                           - Continue present medications.                           - Await pathology results.                           - PPI once a day for 2 weeks.                           - Drop in Hb is likely hemodilutional (patient was                             given 4.5 L of Lactated Ringer's) Procedure Code(s):        --- Professional ---                           [phone], Esophagogastroduodenoscopy, flexible,                            transoral; with biopsy, single or multiple Diagnosis Code(s):        --- Professional ---                           K22.89, Other specified disease of esophagus                           K31.89, Other diseases of stomach and duodenum                           R19.5, Other fecal abnormalities CPT copyright 2022 American [employer] Association. All rights reserved. The codes documented in this report are preliminary and upon coder review may  be revised to meet current compliance requirements. Kerin Salen, MD 11/05/2023 8:07:30 AM This report has been signed electronically. Number of Addenda: 0

## 2023-11-05 NOTE — Progress Notes (Signed)
  Echocardiogram 2D Echocardiogram has been performed.  Robert Sanford 11/05/2023, 11:40 AM

## 2023-11-05 NOTE — TOC CM/SW Note (Signed)
 Transition of Care [facility]) - Inpatient Brief Assessment   Patient Details  Name: Robert Sanford MRN: [id number] Date of Birth: 07-Aug-1986  [facility] of [facility]) CM/SW Contact:    Leone Haven, RN Phone Number: 11/05/2023, 3:34 PM   Clinical Narrative:  From home with spouse, has PCP , Kae Heller and insurance on file, has no HH services in place at this time or DME at home.  Family member will transport them home at dc and family is support system, gets medications from [facility] in [place].  Pta self ambulatory.  Transition of Care Asessment: Insurance and Status: Insurance coverage has been reviewed Patient has primary care physician: Yes Home environment has been reviewed: lives with wife Prior level of function:: indep Prior/Current Home Services: No current home services Social Drivers of Health Review: SDOH reviewed no interventions necessary Readmission risk has been reviewed: Yes Transition of care needs: no transition of care needs at this time

## 2023-11-05 NOTE — Progress Notes (Signed)
 Admitted [facility] assigned patient, spoke with [facility] who will be providing care on 11/06/2023.

## 2023-11-05 NOTE — Procedures (Signed)
 Patient Name: Robert Sanford  MRN: [id number]  Epilepsy Attending: Charlsie Quest  Referring Physician/Provider: Faith Rogue, DO  Date: 11/04/2023 Duration: 23.06 mins  Patient history: 38yo M with syncope. EEG to evaluate for seizure  Level of alertness: Awake, asleep  AEDs during EEG study: None  Technical aspects: This EEG study was done with scalp electrodes positioned according to the 10-20 International system of electrode placement. Electrical activity was reviewed with band pass filter of 1-70Hz , sensitivity of 7 uV/mm, display speed of 75mm/sec with a 60Hz  notched filter applied as appropriate. EEG data were recorded continuously and digitally stored.  Video monitoring was available and reviewed as appropriate.  Description: The posterior dominant rhythm consists of 9 Hz activity of moderate voltage (25-35 uV) seen predominantly in posterior head regions, symmetric and reactive to eye opening and eye closing. Sleep was characterized by vertex waves, sleep spindles (12 to 14 Hz), maximal frontocentral region. Hyperventilation and photic stimulation were not performed.     IMPRESSION: This study is within normal limits. No seizures or epileptiform discharges were seen throughout the recording.  A normal interictal EEG does not exclude the diagnosis of epilepsy.  Leanndra Pember Annabelle Harman

## 2023-11-05 NOTE — Anesthesia Preprocedure Evaluation (Signed)
 Anesthesia Evaluation  Patient identified by MRN, date of birth, ID band Patient awake    Reviewed: Allergy & Precautions, H&P , NPO status , Patient's Chart, lab work & pertinent test results  Airway Mallampati: II   Neck ROM: full    Dental   Pulmonary former smoker   breath sounds clear to auscultation       Cardiovascular negative cardio ROS  Rhythm:regular Rate:Normal     Neuro/Psych   Anxiety        GI/Hepatic melena   Endo/Other    Renal/GU      Musculoskeletal   Abdominal   Peds  Hematology  (+) Blood dyscrasia, anemia Hemoglobin 7.9   Anesthesia Other Findings   Reproductive/Obstetrics                             Anesthesia Physical Anesthesia Plan  ASA: 2  Anesthesia Plan: MAC   Post-op Pain Management:    Induction: Intravenous  PONV Risk Score and Plan: 1 and Propofol infusion and Treatment may vary due to age or medical condition  Airway Management Planned: Nasal Cannula  Additional Equipment:   Intra-op Plan:   Post-operative Plan:   Informed Consent: I have reviewed the patients History and Physical, chart, labs and discussed the procedure including the risks, benefits and alternatives for the proposed anesthesia with the patient or authorized representative who has indicated his/her understanding and acceptance.     Dental advisory given  Plan Discussed with: CRNA, Anesthesiologist and Surgeon  Anesthesia Plan Comments:        Anesthesia Quick Evaluation

## 2023-11-05 NOTE — Progress Notes (Signed)
 Patients wife in room, wanting to talk to GI and primary team stating "we are not leaving without a coloscopy so I need to talk to them." Primary team spoke to wife and ordered CBC. RN went into room to give meds and told wife that GI would come see  him tomorrow morning. Wife saw that CBC came back and hemoglobin was now 7.2 and is requesting to talk to primary team or GI again. Primary team made aware.

## 2023-11-05 NOTE — Progress Notes (Signed)
 Rounding Note    Patient Name: Robert Sanford Date of Encounter: 11/05/2023  [facility] Cardiologist: Orbie Pyo, MD    Subjective    38 yo with hx of syncope yesterday in the setting of GI bleed, volume depletion , lactic acidosis, CKP elevation ( ? Mild rhabdo)  Troponins were mildly elevated.  No CP   Has had his EGD   The Hb this am is 7.9 which is almost certainly due to hemodilution and not ongoing GI bleeding . ECG report is not back but his wife reports that she was told there was no additional bleeding     Inpatient Medications    Scheduled Meds:  pantoprazole (PROTONIX) IV  40 mg Intravenous Q12H   Continuous Infusions:  PRN Meds:    Vital Signs    Vitals:   11/05/23 0808 11/05/23 0810 11/05/23 0815 11/05/23 0830  BP: (!) 91/51 (!) 94/58 (!) 96/49 115/61  Pulse: (!) 101 96 93 82  Resp: 13 17 13 11   Temp: 98.1 F (36.7 C)   98.5 F (36.9 C)  TempSrc:      SpO2: 100% 100% 100% 100%  Weight:      Height:        Intake/Output Summary (Last 24 hours) at 11/05/2023 0857 Last data filed at 11/05/2023 0810 Gross per 24 hour  Intake 1420 ml  Output --  Net 1420 ml      11/05/2023    4:28 AM 11/04/2023    9:03 PM 11/04/2023    8:52 PM  Last 3 Weights  Weight (lbs) 192 lb 9.6 oz 192 lb 9.6 oz 192 lb 9.6 oz  Weight (kg) 87.363 kg 87.363 kg 87.363 kg      Telemetry    Sinus tach  - Personally Reviewed  ECG     - Personally Reviewed  Physical Exam   GEN: No acute distress.   Neck: No JVD Cardiac: RRR, no murmurs heard today  Respiratory: Clear to auscultation bilaterally. GI: Soft, nontender, non-distended  MS: No edema; No deformity. Neuro:  Nonfocal  Psych: Normal affect   Labs    High Sensitivity Troponin:   Recent Labs  Lab 11/04/23 1038 11/04/23 1234  TROPONINIHS 81* 151*     Chemistry Recent Labs  Lab 11/04/23 1038 11/04/23 1539 11/04/23 2207 11/05/23 0214  NA 133* 135  --  136  K 6.7* 5.1  --  3.8   CL 99 98  --  97*  CO2 27 29  --  29  GLUCOSE 131* 93  --  91  BUN 33* 29*  --  25*  CREATININE 1.39* 1.18  --  1.10  CALCIUM 7.4* 8.0*  --  7.6*  MG  --   --  1.7  --   PROT 4.9*  --   --  4.1*  ALBUMIN 2.8*  --   --  2.3*  AST 48*  --   --  31  ALT 42  --   --  30  ALKPHOS 21*  --   --  17*  BILITOT 0.7  --   --  0.6  GFRNONAA >60 >60  --  >60  ANIONGAP 7 8  --  10    Lipids No results for input(s): "CHOL", "TRIG", "HDL", "LABVLDL", "LDLCALC", "CHOLHDL" in the last 168 hours.  Hematology Recent Labs  Lab 11/04/23 1038 11/05/23 0214  WBC 17.2* 13.9*  RBC 3.45* 2.51*  HGB 11.0* 7.9*  HCT 33.8* 23.9*  MCV 98.0 95.2  MCH 31.9 31.5  MCHC 32.5 33.1  RDW 13.8 13.8  PLT 242 192   Thyroid  Recent Labs  Lab 11/04/23 1234  TSH 1.855    BNPNo results for input(s): "BNP", "PROBNP" in the last 168 hours.  DDimer  Recent Labs  Lab 11/04/23 1038  DDIMER <0.27     Radiology    EEG adult Result Date: 11/05/2023 Charlsie Quest, MD     11/05/2023  8:43 AM Patient Name: Robert Sanford MRN: [id number] Epilepsy Attending: Charlsie Quest Referring Physician/Provider: Faith Rogue, DO Date: 11/04/2023 Duration: 23.06 mins Patient history: 38yo M with syncope. EEG to evaluate for seizure Level of alertness: Awake, asleep AEDs during EEG study: None Technical aspects: This EEG study was done with scalp electrodes positioned according to the 10-20 International system of electrode placement. Electrical activity was reviewed with band pass filter of 1-70Hz , sensitivity of 7 uV/mm, display speed of 37mm/sec with a 60Hz  notched filter applied as appropriate. EEG data were recorded continuously and digitally stored.  Video monitoring was available and reviewed as appropriate. Description: The posterior dominant rhythm consists of 9 Hz activity of moderate voltage (25-35 uV) seen predominantly in posterior head regions, symmetric and reactive to eye opening and eye closing. Sleep was  characterized by vertex waves, sleep spindles (12 to 14 Hz), maximal frontocentral region. Hyperventilation and photic stimulation were not performed.   IMPRESSION: This study is within normal limits. No seizures or epileptiform discharges were seen throughout the recording. A normal interictal EEG does not exclude the diagnosis of epilepsy. Charlsie Quest   CT ABDOMEN PELVIS W CONTRAST Result Date: 11/04/2023 CLINICAL DATA:  Abdominal pain. EXAM: CT ABDOMEN AND PELVIS WITH CONTRAST TECHNIQUE: Multidetector CT imaging of the abdomen and pelvis was performed using the standard protocol following bolus administration of intravenous contrast. RADIATION DOSE REDUCTION: This exam was performed according to the departmental dose-optimization program which includes automated exposure control, adjustment of the mA and/or kV according to patient size and/or use of iterative reconstruction technique. CONTRAST:  75mL OMNIPAQUE IOHEXOL 350 MG/ML SOLN COMPARISON:  None Available. FINDINGS: Lower chest: The visualized lung bases are clear. No intra-abdominal free air or free fluid. Hepatobiliary: The liver is unremarkable. No biliary dilatation. The gallbladder is unremarkable. Pancreas: Unremarkable. No pancreatic ductal dilatation or surrounding inflammatory changes. Spleen: Normal in size without focal abnormality. Adrenals/Urinary Tract: The adrenal glands unremarkable. Small left renal inferior pole cyst. There is no hydronephrosis on either side. There is symmetric enhancement and excretion of contrast by both kidneys. The visualized ureters and urinary bladder appear unremarkable. Stomach/Bowel: There is no bowel obstruction or active inflammation. The appendix is normal. Vascular/Lymphatic: The abdominal aorta and IVC are unremarkable. No portal venous gas. There is no adenopathy. Reproductive: The prostate and seminal vesicles are grossly unremarkable. No pelvic mass. Other: None Musculoskeletal: Mild diffuse  subcutaneous edema. No fluid collection. No acute osseous pathology. IMPRESSION: 1. No acute intra-abdominal or pelvic pathology. 2. No bowel obstruction. Normal appendix. Electronically Signed   By: Elgie Collard M.D.   On: 11/04/2023 13:01   CT Head Wo Contrast Result Date: 11/04/2023 CLINICAL DATA:  Ataxia, head trauma EXAM: CT HEAD WITHOUT CONTRAST TECHNIQUE: Contiguous axial images were obtained from the base of the skull through the vertex without intravenous contrast. RADIATION DOSE REDUCTION: This exam was performed according to the departmental dose-optimization program which includes automated exposure control, adjustment of the mA and/or kV according to patient size and/or use of iterative  reconstruction technique. COMPARISON:  MRI head July 06, 2016. FINDINGS: Brain: No evidence of acute infarction, hemorrhage, hydrocephalus, extra-axial collection or mass lesion/mass effect. Vascular: No hyperdense vessel. Skull: No acute fracture. Sinuses/Orbits: Clear sinuses.  No acute orbital findings. Other: No mastoid effusions. IMPRESSION: No evidence of acute intracranial abnormality. Electronically Signed   By: Feliberto Harts M.D.   On: 11/04/2023 12:55   DG Chest Portable 1 View Result Date: 11/04/2023 CLINICAL DATA:  Syncope EXAM: PORTABLE CHEST 1 VIEW COMPARISON:  May 25, 2020. FINDINGS: The cardiomediastinal silhouette is unchanged in contour. No pleural effusion. No pneumothorax. No acute pleuroparenchymal abnormality. IMPRESSION: No acute cardiopulmonary abnormality. Electronically Signed   By: Meda Klinefelter M.D.   On: 11/04/2023 11:04    Cardiac Studies     Patient Profile     38 y.o. male  with acute syncope - likely due to GI bleed  Assessment & Plan    Syncope: The patient was admitted yesterday after a syncopal episode.  He had intense belly pain and had colonic stool.  He was unconscious for several minutes.  His eyes were rolled up in the back of his  head.  I suspect he had profound hypotension in the setting of acute on chronic GI bleed. I suspect that his had a chronic GI bleed for the past several weeks because of his history of melena for the past several weeks.  I suspect he had an acute additional blood loss yesterday because of his sudden episode of hypotension, hypoperfusion, lactic acidosis that occurred yesterday morning.  EGD was reportedly negative.  I will defer to GI for further workup   2.  Elevated troponin level.  Is not unusual that the patient would have an elevated troponin level in the setting of hypoperfusion, lactic acidosis, volume depletion and anemia.  Will get an echocardiogram.  Will anticipate getting a CT angiogram for perhaps a Myoview study likely as an outpatient.  I would like to wait until his anemia has improved before we start him on a cardiac workup unless he has significant abnormalities seen on his echo or has further cardiac symptoms/findings.    For questions or updates, please contact [facility] Please consult [email or web address] for contact info under        Signed, Kristeen Miss, MD  11/05/2023, 8:57 AM

## 2023-11-05 NOTE — Transfer of Care (Signed)
 Immediate Anesthesia Transfer of Care Note  Patient: Robert Sanford  Procedure(s) Performed: ESOPHAGOGASTRODUODENOSCOPY (EGD) WITH PROPOFOL BIOPSY  Patient Location: PACU  Anesthesia Type:MAC  Level of Consciousness: awake, alert , and patient cooperative  Airway & Oxygen Therapy: Patient Spontanous Breathing and Patient connected to nasal cannula oxygen  Post-op Assessment: Report given to RN and Post -op Vital signs reviewed and stable  Post vital signs: Reviewed and stable  Last Vitals:  Vitals Value Taken Time  BP 91/51 11/05/23 0808  Temp    Pulse 96 11/05/23 0809  Resp 17 11/05/23 0809  SpO2 100 % 11/05/23 0809  Vitals shown include unfiled device data.  Last Pain:  Vitals:   11/05/23 0720  TempSrc: Tympanic  PainSc: 0-No pain         Complications: No notable events documented.

## 2023-11-06 DIAGNOSIS — K922 Gastrointestinal hemorrhage, unspecified: Secondary | ICD-10-CM

## 2023-11-06 LAB — CBC WITH DIFFERENTIAL/PLATELET
Abs Immature Granulocytes: 0 10*3/uL (ref 0.00–0.07)
Basophils Absolute: 0.1 10*3/uL (ref 0.0–0.1)
Basophils Relative: 1 %
Eosinophils Absolute: 0.1 10*3/uL (ref 0.0–0.5)
Eosinophils Relative: 1 %
HCT: 22.4 % — ABNORMAL LOW (ref 39.0–52.0)
Hemoglobin: 7.5 g/dL — ABNORMAL LOW (ref 13.0–17.0)
Lymphocytes Relative: 11 %
Lymphs Abs: 1.2 10*3/uL (ref 0.7–4.0)
MCH: 32.5 pg (ref 26.0–34.0)
MCHC: 33.5 g/dL (ref 30.0–36.0)
MCV: 97 fL (ref 80.0–100.0)
Monocytes Absolute: 0.1 10*3/uL (ref 0.1–1.0)
Monocytes Relative: 1 %
Neutro Abs: 9.6 10*3/uL — ABNORMAL HIGH (ref 1.7–7.7)
Neutrophils Relative %: 86 %
Platelets: 206 10*3/uL (ref 150–400)
RBC: 2.31 MIL/uL — ABNORMAL LOW (ref 4.22–5.81)
RDW: 14 % (ref 11.5–15.5)
WBC: 11.2 10*3/uL — ABNORMAL HIGH (ref 4.0–10.5)
nRBC: 3 % — ABNORMAL HIGH (ref 0.0–0.2)
nRBC: 4 /100{WBCs} — ABNORMAL HIGH

## 2023-11-06 LAB — CBC
HCT: 21.7 % — ABNORMAL LOW (ref 39.0–52.0)
Hemoglobin: 7.2 g/dL — ABNORMAL LOW (ref 13.0–17.0)
MCH: 31.7 pg (ref 26.0–34.0)
MCHC: 33.2 g/dL (ref 30.0–36.0)
MCV: 95.6 fL (ref 80.0–100.0)
Platelets: 191 10*3/uL (ref 150–400)
RBC: 2.27 MIL/uL — ABNORMAL LOW (ref 4.22–5.81)
RDW: 14.1 % (ref 11.5–15.5)
WBC: 10 10*3/uL (ref 4.0–10.5)
nRBC: 2.1 % — ABNORMAL HIGH (ref 0.0–0.2)

## 2023-11-06 LAB — CK: Total CK: 325 U/L (ref 49–397)

## 2023-11-06 LAB — BASIC METABOLIC PANEL
Anion gap: 13 (ref 5–15)
BUN: 19 mg/dL (ref 6–20)
CO2: 29 mmol/L (ref 22–32)
Calcium: 8.6 mg/dL — ABNORMAL LOW (ref 8.9–10.3)
Chloride: 98 mmol/L (ref 98–111)
Creatinine, Ser: 1.03 mg/dL (ref 0.61–1.24)
GFR, Estimated: >60 mL/min (ref >60)
Glucose, Bld: 101 mg/dL — ABNORMAL HIGH (ref 70–99)
Potassium: 4 mmol/L (ref 3.5–5.1)
Sodium: 140 mmol/L (ref 135–145)

## 2023-11-06 LAB — IRON AND TIBC
Iron: 30 ug/dL — ABNORMAL LOW (ref 45–182)
Saturation Ratios: 9 % — ABNORMAL LOW (ref 17.9–39.5)
TIBC: 354 ug/dL (ref 250–450)
UIBC: 324 ug/dL

## 2023-11-06 LAB — FERRITIN: Ferritin: 49 ng/mL (ref 24–336)

## 2023-11-06 LAB — MAGNESIUM: Magnesium: 1.8 mg/dL (ref 1.7–2.4)

## 2023-11-06 MED ORDER — SODIUM CHLORIDE 0.9 % IV SOLN
500.0000 mg | INTRAVENOUS | Status: DC
  Administered 2023-11-06 – 2023-11-07 (×2): 500 mg via INTRAVENOUS
  Filled 2023-11-06 (×3): qty 25

## 2023-11-06 NOTE — Plan of Care (Signed)

## 2023-11-06 NOTE — Progress Notes (Signed)
 Progress Note  Patient Name: Robert Sanford Date of Encounter: 11/06/2023  Primary Cardiologist: Orbie Pyo, MD   Subjective   No chest pain or sob. No additional bleeding  Inpatient Medications    Scheduled Meds:  pantoprazole  40 mg Oral Daily   Continuous Infusions:  iron sucrose 500 mg (11/06/23 1028)   PRN Meds:    Vital Signs    Vitals:   11/05/23 1920 11/05/23 2331 11/06/23 0639 11/06/23 0732  BP: (!) 137/55 (!) 119/51 113/66 (!) 134/53  Pulse: 95 92 78   Resp: (!) 22 17 13    Temp: 98.2 F (36.8 C) 98.9 F (37.2 C) 98.1 F (36.7 C) 98.3 F (36.8 C)  TempSrc: Oral Oral Oral Oral  SpO2:   99%   Weight:   85 kg   Height:        Intake/Output Summary (Last 24 hours) at 11/06/2023 1055 Last data filed at 11/05/2023 1724 Gross per 24 hour  Intake 600 ml  Output --  Net 600 ml   Filed Weights   11/04/23 2103 11/05/23 0428 11/06/23 0639  Weight: 87.4 kg 87.4 kg 85 kg    Telemetry    nsr - Personally Reviewed  ECG    none - Personally Reviewed  Physical Exam   GEN: No acute distress.   Neck: No JVD Cardiac: RRR, no murmurs, rubs, or gallops.  Respiratory: Clear to auscultation bilaterally. GI: Soft, nontender, non-distended  MS: No edema; No deformity. Neuro:  Nonfocal  Psych: Normal affect   Labs    Chemistry Recent Labs  Lab 11/04/23 1038 11/04/23 1539 11/05/23 0214 11/06/23 0211  NA 133* 135 136 140  K 6.7* 5.1 3.8 4.0  CL 99 98 97* 98  CO2 27 29 29 29   GLUCOSE 131* 93 91 101*  BUN 33* 29* 25* 19  CREATININE 1.39* 1.18 1.10 1.03  CALCIUM 7.4* 8.0* 7.6* 8.6*  PROT 4.9*  --  4.1*  --   ALBUMIN 2.8*  --  2.3*  --   AST 48*  --  31  --   ALT 42  --  30  --   ALKPHOS 21*  --  17*  --   BILITOT 0.7  --  0.6  --   GFRNONAA >60 >60 >60 >60  ANIONGAP 7 8 10 13      Hematology Recent Labs  Lab 11/05/23 1405 11/05/23 2027 11/06/23 0211  WBC 8.4 10.1 10.0  RBC 2.26* 2.35* 2.27*  HGB 7.2* 7.3* 7.2*  HCT 21.8* 22.5*  21.7*  MCV 96.5 95.7 95.6  MCH 31.9 31.1 31.7  MCHC 33.0 32.4 33.2  RDW 13.9 14.0 14.1  PLT 185 194 191    Cardiac EnzymesNo results for input(s): "TROPONINI" in the last 168 hours. No results for input(s): "TROPIPOC" in the last 168 hours.   BNPNo results for input(s): "BNP", "PROBNP" in the last 168 hours.   DDimer  Recent Labs  Lab 11/04/23 1038  DDIMER <0.27     Radiology    ECHOCARDIOGRAM COMPLETE Result Date: 11/05/2023    ECHOCARDIOGRAM REPORT   Patient Name:   Robert Sanford Date of Exam: 11/05/2023 Medical Rec #:  [id number]      Height:       67.0 in Accession #:    [id number]     Weight:       192.6 lb Date of Birth:  [birth year]      BSA:  1.990 m Patient Age:    37 years       BP:           115/65 mmHg Patient Gender: M              HR:           85 bpm. Exam Location:  Inpatient Procedure: 2D Echo, Cardiac Doppler, Color Doppler, 3D Echo and Strain Analysis            (Both Spectral and Color Flow Doppler were utilized during            procedure). Indications:    R55 Syncope  History:        Patient has no prior history of Echocardiogram examinations.                 Signs/Symptoms:Syncope and Chest Pain. Elevated troponin.  Sonographer:    Sheralyn Boatman RDCS Referring Phys: [id number] Jonita Albee IMPRESSIONS  1. Left ventricular ejection fraction, by estimation, is 55 to 60%. The left ventricle has normal function. The left ventricle has no regional wall motion abnormalities. There is mild concentric left ventricular hypertrophy. Left ventricular diastolic parameters were normal. The average left ventricular global longitudinal strain is -14.3 %. The global longitudinal strain is abnormal.  2. Right ventricular systolic function is low normal. The right ventricular size is normal.  3. The mitral valve is normal in structure. Trivial mitral valve regurgitation. No evidence of mitral stenosis.  4. The aortic valve is tricuspid. Aortic valve regurgitation is not visualized. No  aortic stenosis is present.  5. The inferior vena cava is normal in size with greater than 50% respiratory variability, suggesting right atrial pressure of 3 mmHg. Comparison(s): No prior Echocardiogram. FINDINGS  Left Ventricle: Left ventricular ejection fraction, by estimation, is 55 to 60%. The left ventricle has normal function. The left ventricle has no regional wall motion abnormalities. The average left ventricular global longitudinal strain is -14.3 %. Strain was performed and the global longitudinal strain is abnormal. The left ventricular internal cavity size was small. There is mild concentric left ventricular hypertrophy. Left ventricular diastolic parameters were normal. Right Ventricle: The right ventricular size is normal. Right ventricular systolic function is low normal. Left Atrium: Left atrial size was normal in size. Right Atrium: Right atrial size was normal in size. Pericardium: Trivial pericardial effusion is present. Mitral Valve: The mitral valve is normal in structure. Trivial mitral valve regurgitation. No evidence of mitral valve stenosis. Tricuspid Valve: The tricuspid valve is normal in structure. Tricuspid valve regurgitation is trivial. No evidence of tricuspid stenosis. Aortic Valve: The aortic valve is tricuspid. Aortic valve regurgitation is not visualized. No aortic stenosis is present. Pulmonic Valve: The pulmonic valve was normal in structure. Pulmonic valve regurgitation is trivial. No evidence of pulmonic stenosis. Aorta: The aortic root and ascending aorta are structurally normal, with no evidence of dilitation. Venous: The inferior vena cava is normal in size with greater than 50% respiratory variability, suggesting right atrial pressure of 3 mmHg. IAS/Shunts: No atrial level shunt detected by color flow Doppler. Additional Comments: 3D was performed not requiring image post processing on an independent workstation and was normal.  LEFT VENTRICLE PLAX 2D LVIDd:         4.80  cm     Diastology LVIDs:         3.40 cm     LV e' medial:    13.20 cm/s LV PW:  1.40 cm     LV E/e' medial:  4.6 LV IVS:        1.10 cm     LV e' lateral:   12.60 cm/s LVOT diam:     2.30 cm     LV E/e' lateral: 4.8 LV SV:         66 LV SV Index:   33          2D Longitudinal Strain LVOT Area:     4.15 cm    2D Strain GLS Avg:     -14.3 %  LV Volumes (MOD) LV vol d, MOD A2C: 99.2 ml 3D Volume EF: LV vol d, MOD A4C: 86.0 ml 3D EF:        54 % LV vol s, MOD A2C: 53.5 ml LV vol s, MOD A4C: 40.3 ml LV SV MOD A2C:     45.7 ml LV SV MOD A4C:     86.0 ml LV SV MOD BP:      44.3 ml RIGHT VENTRICLE             IVC RV S prime:     10.30 cm/s  IVC diam: 1.80 cm TAPSE (M-mode): 2.3 cm LEFT ATRIUM             Index        RIGHT ATRIUM           Index LA diam:        2.60 cm 1.31 cm/m   RA Area:     12.80 cm LA Vol (A2C):   30.4 ml 15.28 ml/m  RA Volume:   29.40 ml  14.77 ml/m LA Vol (A4C):   22.6 ml 11.36 ml/m LA Biplane Vol: 27.4 ml 13.77 ml/m  AORTIC VALVE LVOT Vmax:   103.00 cm/s LVOT Vmean:  68.400 cm/s LVOT VTI:    0.160 m  AORTA Ao Root diam: 3.10 cm Ao Asc diam:  2.90 cm MITRAL VALVE MV Area (PHT): 4.35 cm    SHUNTS MV Decel Time: 175 msec    Systemic VTI:  0.16 m MV E velocity: 60.20 cm/s  Systemic Diam: 2.30 cm MV A velocity: 52.80 cm/s MV E/A ratio:  1.14 Olga Millers MD Electronically signed by Olga Millers MD Signature Date/Time: 11/05/2023/11:44:40 AM    Final    EEG adult Result Date: 11/05/2023 Charlsie Quest, MD     11/05/2023  8:43 AM Patient Name: Taiden Raybourn MRN: [id number] Epilepsy Attending: Charlsie Quest Referring Physician/Provider: Faith Rogue, DO Date: 11/04/2023 Duration: 23.06 mins Patient history: 38yo M with syncope. EEG to evaluate for seizure Level of alertness: Awake, asleep AEDs during EEG study: None Technical aspects: This EEG study was done with scalp electrodes positioned according to the 10-20 International system of electrode placement. Electrical activity was  reviewed with band pass filter of 1-70Hz , sensitivity of 7 uV/mm, display speed of 75mm/sec with a 60Hz  notched filter applied as appropriate. EEG data were recorded continuously and digitally stored.  Video monitoring was available and reviewed as appropriate. Description: The posterior dominant rhythm consists of 9 Hz activity of moderate voltage (25-35 uV) seen predominantly in posterior head regions, symmetric and reactive to eye opening and eye closing. Sleep was characterized by vertex waves, sleep spindles (12 to 14 Hz), maximal frontocentral region. Hyperventilation and photic stimulation were not performed.   IMPRESSION: This study is within normal limits. No seizures or epileptiform discharges were seen throughout the recording. A normal interictal EEG does not  exclude the diagnosis of epilepsy. Charlsie Quest   CT ABDOMEN PELVIS W CONTRAST Result Date: 11/04/2023 CLINICAL DATA:  Abdominal pain. EXAM: CT ABDOMEN AND PELVIS WITH CONTRAST TECHNIQUE: Multidetector CT imaging of the abdomen and pelvis was performed using the standard protocol following bolus administration of intravenous contrast. RADIATION DOSE REDUCTION: This exam was performed according to the departmental dose-optimization program which includes automated exposure control, adjustment of the mA and/or kV according to patient size and/or use of iterative reconstruction technique. CONTRAST:  75mL OMNIPAQUE IOHEXOL 350 MG/ML SOLN COMPARISON:  None Available. FINDINGS: Lower chest: The visualized lung bases are clear. No intra-abdominal free air or free fluid. Hepatobiliary: The liver is unremarkable. No biliary dilatation. The gallbladder is unremarkable. Pancreas: Unremarkable. No pancreatic ductal dilatation or surrounding inflammatory changes. Spleen: Normal in size without focal abnormality. Adrenals/Urinary Tract: The adrenal glands unremarkable. Small left renal inferior pole cyst. There is no hydronephrosis on either side. There is  symmetric enhancement and excretion of contrast by both kidneys. The visualized ureters and urinary bladder appear unremarkable. Stomach/Bowel: There is no bowel obstruction or active inflammation. The appendix is normal. Vascular/Lymphatic: The abdominal aorta and IVC are unremarkable. No portal venous gas. There is no adenopathy. Reproductive: The prostate and seminal vesicles are grossly unremarkable. No pelvic mass. Other: None Musculoskeletal: Mild diffuse subcutaneous edema. No fluid collection. No acute osseous pathology. IMPRESSION: 1. No acute intra-abdominal or pelvic pathology. 2. No bowel obstruction. Normal appendix. Electronically Signed   By: Elgie Collard M.D.   On: 11/04/2023 13:01   CT Head Wo Contrast Result Date: 11/04/2023 CLINICAL DATA:  Ataxia, head trauma EXAM: CT HEAD WITHOUT CONTRAST TECHNIQUE: Contiguous axial images were obtained from the base of the skull through the vertex without intravenous contrast. RADIATION DOSE REDUCTION: This exam was performed according to the departmental dose-optimization program which includes automated exposure control, adjustment of the mA and/or kV according to patient size and/or use of iterative reconstruction technique. COMPARISON:  MRI head July 06, 2016. FINDINGS: Brain: No evidence of acute infarction, hemorrhage, hydrocephalus, extra-axial collection or mass lesion/mass effect. Vascular: No hyperdense vessel. Skull: No acute fracture. Sinuses/Orbits: Clear sinuses.  No acute orbital findings. Other: No mastoid effusions. IMPRESSION: No evidence of acute intracranial abnormality. Electronically Signed   By: Feliberto Harts M.D.   On: 11/04/2023 12:55   DG Chest Portable 1 View Result Date: 11/04/2023 CLINICAL DATA:  Syncope EXAM: PORTABLE CHEST 1 VIEW COMPARISON:  May 25, 2020. FINDINGS: The cardiomediastinal silhouette is unchanged in contour. No pleural effusion. No pneumothorax. No acute pleuroparenchymal abnormality.  IMPRESSION: No acute cardiopulmonary abnormality. Electronically Signed   By: Meda Klinefelter M.D.   On: 11/04/2023 11:04    Cardiac Studies   Echo reviewed  Patient Profile     38 y.o. male admitted with syncope due to GI bleeding. The slight troponin elevation is due to the blood loss/stress.   Assessment & Plan    Syncope - this is secondary to GI blood loss. His echo is normal. No additional cardiac work up is indicated. We will sign off. No additional cardiac workup or followup is indicated. [facility] will sign off.   Medication Recommendations:  none Other recommendations (labs, testing, etc):  none Follow up as an outpatient:  none  For questions or updates, please contact [facility] Please consult [email or web address] for contact info under Cardiology/STEMI.      Signed, Lewayne Bunting, MD  11/06/2023, 10:55 AM

## 2023-11-06 NOTE — Progress Notes (Signed)
 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  He was taking ibuprofen for sciatica pain, 800 mg every other day. He denies feeling lightheaded or SOB during workouts when he is heavy lifting. Patient reports a heart murmur that has been present since a child that has not caused "problems".  wife reported that she didn't see his "neck pulsating" and that's when she started CPR, but did not palpate for a pulse. She does not remember if he was confused when he woke up but he was able to speak coherently. Per EMS run sheet, patient was alert and oriented to place, time, person, and event.  Patient was admitted for syncope and acute blood loss anemia, presumably secondary to GI bleed, GI and cardiology consulted.  Patient was initially admitted by IM TS but transferred under [facility] care on 11/06/2023/today.  Assessment & Plan:   Principal Problem:   Acute GI bleeding Active Problems:   Syncope   Hyperkalemia   Elevated CK  Syncope: Seen by cardiology.  Presumably, his syncope was secondary to hypotension secondary to GI bleed.  He is still feeling dizzy at times and probably but no reports of syncope since admission.  Echo ruled out valvular heart disease.  Acute blood loss anemia secondary to presumed acute GI bleed: Previously had normal hemoglobin, came in with hemoglobin of 11 which has gradually dropped to 7.2.  Patient reports few episodes of melena/black stools but no hematochezia or hematemesis.  FOBT positive.  GI consulted.  Status post EGD by Dr. Pati Gallo on 11/05/2023 which had no evidence of active bleeding other than Erythematous mucosa in the antrum. Biopsied.  No plans for colonoscopy.  Wife has a lot of  questions with GI and wants to have colonoscopy.  Lengthy discussion with the wife, and the setting of no bright red blood per rectum, next step should be capsule endoscopy.  I have discussed my thoughts with the GI/Dr. Dulce Sellar who is going to see patient and ultimately we will follow GIs recommendations.  Monitor hemoglobin every 12 hours and transfuse if less than 7.  Continue Protonix.  Elevated troponin: Slightly elevated and flat, likely demand ischemia.  Echo ruled out wall motion abnormality.  Slightly elevated CK: Does not quite meet criteria for rhabdomyolysis.  CK improving.  Lactic acidosis: Likely due to GI bleed.  Lactic acidosis resolved.  Hyperkalemia: Resolved.  DVT prophylaxis: Place and maintain sequential compression device Start: 11/04/23 1623 SCDs Start: 11/04/23 1502   Code Status: Full Code  Family Communication: Wife present at bedside.  Plan of care discussed with patient in length and he/she verbalized understanding and agreed with it.  Status is: Observation The patient will require care spanning > 2 midnights and should be moved to inpatient because: Will need observation for another night.  Hemoglobin borderline low.   Estimated body mass index is 29.35 kg/m as calculated from the following:   Height as of this encounter: 5\' 7"  (1.702 m).   Weight as of this encounter: 85 kg.    Nutritional Assessment: Body mass index is 29.35 kg/m.Marland Kitchen Seen by dietician.  I agree with the assessment and plan as outlined below: Nutrition Status:        .  Skin Assessment: I have examined the patient's skin and I agree with the wound assessment as performed by the wound care RN as outlined below:    Consultants:  GI and cardiology  Procedures:  As above  Antimicrobials:  Anti-infectives (From admission, onward)    Start     Dose/Rate Route Frequency Ordered Stop   11/04/23 1400  vancomycin (VANCOREADY) IVPB 1750 mg/350 mL        1,750 mg 175 mL/hr over 120  Minutes Intravenous  Once 11/04/23 1349 11/04/23 1805   11/04/23 1245  piperacillin-tazobactam (ZOSYN) IVPB 3.375 g        3.375 g 100 mL/hr over 30 Minutes Intravenous  Once 11/04/23 1230 11/04/23 1612         Subjective: Patient seen and examined, wife at the bedside.  Patient complains of intermittent dizziness and still balance problem with walking.  No chest pain, shortness of breath, palpitation.  Has had couple more episodes of black stools.  Objective: Vitals:   11/05/23 1920 11/05/23 2331 11/06/23 0639 11/06/23 0732  BP: (!) 137/55 (!) 119/51 113/66 (!) 134/53  Pulse: 95 92 78   Resp: (!) 22 17 13    Temp: 98.2 F (36.8 C) 98.9 F (37.2 C) 98.1 F (36.7 C) 98.3 F (36.8 C)  TempSrc: Oral Oral Oral Oral  SpO2:   99%   Weight:   85 kg   Height:        Intake/Output Summary (Last 24 hours) at 11/06/2023 1021 Last data filed at 11/05/2023 1724 Gross per 24 hour  Intake 600 ml  Output --  Net 600 ml   Filed Weights   11/04/23 2103 11/05/23 0428 11/06/23 0639  Weight: 87.4 kg 87.4 kg 85 kg    Examination:  General exam: Appears calm and comfortable  Respiratory system: Clear to auscultation. Respiratory effort normal. Cardiovascular system: S1 & S2 heard, RRR. No JVD, murmurs, rubs, gallops or clicks. No pedal edema. Gastrointestinal system: Abdomen is nondistended, soft and nontender. No organomegaly or masses felt. Normal bowel sounds heard. Central nervous system: Alert and oriented. No focal neurological deficits. Extremities: Symmetric 5 x 5 power. Skin: No rashes, lesions or ulcers Psychiatry: Judgement and insight appear normal. Mood & affect appropriate.    Data Reviewed: I have personally reviewed following labs and imaging studies  CBC: Recent Labs  Lab 11/04/23 1038 11/05/23 0214 11/05/23 1405 11/05/23 2027 11/06/23 0211  WBC 17.2* 13.9* 8.4 10.1 10.0  NEUTROABS 12.3*  --   --   --   --   HGB 11.0* 7.9* 7.2* 7.3* 7.2*  HCT 33.8* 23.9* 21.8*  22.5* 21.7*  MCV 98.0 95.2 96.5 95.7 95.6  PLT 242 192 185 194 191   Basic Metabolic Panel: Recent Labs  Lab 11/04/23 1038 11/04/23 1539 11/04/23 2207 11/05/23 0214 11/06/23 0211  NA 133* 135  --  136 140  K 6.7* 5.1  --  3.8 4.0  CL 99 98  --  97* 98  CO2 27 29  --  29 29  GLUCOSE 131* 93  --  91 101*  BUN 33* 29*  --  25* 19  CREATININE 1.39* 1.18  --  1.10 1.03  CALCIUM 7.4* 8.0*  --  7.6* 8.6*  MG  --   --  1.7  --  1.8   GFR: Estimated Creatinine Clearance: 102.4 mL/min (by C-G formula based on SCr of 1.03 mg/dL). Liver Function Tests: Recent Labs  Lab 11/04/23 1038 11/05/23 0214  AST 48*  31  ALT 42 30  ALKPHOS 21* 17*  BILITOT 0.7 0.6  PROT 4.9* 4.1*  ALBUMIN 2.8* 2.3*   No results for input(s): "LIPASE", "AMYLASE" in the last 168 hours. No results for input(s): "AMMONIA" in the last 168 hours. Coagulation Profile: No results for input(s): "INR", "PROTIME" in the last 168 hours. Cardiac Enzymes: Recent Labs  Lab 11/04/23 1234 11/05/23 0214 11/06/23 0211  CKTOTAL 1,382* 758* 325   BNP (last 3 results) No results for input(s): "PROBNP" in the last 8760 hours. HbA1C: No results for input(s): "HGBA1C" in the last 72 hours. CBG: No results for input(s): "GLUCAP" in the last 168 hours. Lipid Profile: No results for input(s): "CHOL", "HDL", "LDLCALC", "TRIG", "CHOLHDL", "LDLDIRECT" in the last 72 hours. Thyroid Function Tests: Recent Labs    11/04/23 1234  TSH 1.855   Anemia Panel: Recent Labs    11/06/23 0211  FERRITIN 49  TIBC 354  IRON 30*   Sepsis Labs: Recent Labs  Lab 11/04/23 1553 11/04/23 1957 11/04/23 2207 11/05/23 0919  LATICACIDVEN 2.4* 2.0* 2.1* 1.9    Recent Results (from the past 240 hours)  Resp panel by RT-PCR (RSV, Flu A&B, Covid) Anterior Nasal Swab     Status: None   Collection Time: 11/04/23 10:30 AM   Specimen: Anterior Nasal Swab  Result Value Ref Range Status   SARS Coronavirus 2 by RT PCR NEGATIVE NEGATIVE  Final   Influenza A by PCR NEGATIVE NEGATIVE Final   Influenza B by PCR NEGATIVE NEGATIVE Final    Comment: (NOTE) The Xpert Xpress SARS-CoV-2/FLU/RSV plus assay is intended as an aid in the diagnosis of influenza from Nasopharyngeal swab specimens and should not be used as a sole basis for treatment. Nasal washings and aspirates are unacceptable for Xpert Xpress SARS-CoV-2/FLU/RSV testing.  Fact Sheet for Patients: BloggerCourse.com  Fact Sheet for Healthcare Providers: SeriousBroker.it  This test is not yet approved or cleared by the [place] FDA and has been authorized for detection and/or diagnosis of SARS-CoV-2 by FDA under an Emergency Use Authorization (EUA). This EUA will remain in effect (meaning this test can be used) for the duration of the COVID-19 declaration under Section 564(b)(1) of the Act, 21 U.S.C. section 360bbb-3(b)(1), unless the authorization is terminated or revoked.     Resp Syncytial Virus by PCR NEGATIVE NEGATIVE Final    Comment: (NOTE) Fact Sheet for Patients: BloggerCourse.com  Fact Sheet for Healthcare Providers: SeriousBroker.it  This test is not yet approved or cleared by the [place] FDA and has been authorized for detection and/or diagnosis of SARS-CoV-2 by FDA under an Emergency Use Authorization (EUA). This EUA will remain in effect (meaning this test can be used) for the duration of the COVID-19 declaration under Section 564(b)(1) of the Act, 21 U.S.C. section 360bbb-3(b)(1), unless the authorization is terminated or revoked.  Performed at [facility], [address] [place], [place] [address]   Blood culture (routine x 2)     Status: None (Preliminary result)   Collection Time: 11/04/23 11:42 AM   Specimen: BLOOD RIGHT FOREARM  Result Value Ref Range Status   Specimen Description BLOOD RIGHT FOREARM  Final   Special  Requests   Final    BOTTLES DRAWN AEROBIC AND ANAEROBIC Blood Culture adequate volume   Culture   Final    NO GROWTH 2 DAYS Performed at [facility], [address] [place], [place] [address]    Report Status PENDING  Incomplete  Blood culture (  routine x 2)     Status: None (Preliminary result)   Collection Time: 11/04/23  3:39 PM   Specimen: BLOOD  Result Value Ref Range Status   Specimen Description BLOOD RIGHT ANTECUBITAL  Final   Special Requests   Final    BOTTLES DRAWN AEROBIC AND ANAEROBIC Blood Culture adequate volume   Culture   Final    NO GROWTH 2 DAYS Performed at [facility], [address], [place], [place] [address]    Report Status PENDING  Incomplete     Radiology Studies: ECHOCARDIOGRAM COMPLETE Result Date: 11/05/2023    ECHOCARDIOGRAM REPORT   Patient Name:   ALEXANDRO LINE Date of Exam: 11/05/2023 Medical Rec #:  [id number]      Height:       67.0 in Accession #:    [id number]     Weight:       192.6 lb Date of Birth:  1985-10-27      BSA:          1.990 m Patient Age:    37 years       BP:           115/65 mmHg Patient Gender: M              HR:           85 bpm. Exam Location:  Inpatient Procedure: 2D Echo, Cardiac Doppler, Color Doppler, 3D Echo and Strain Analysis            (Both Spectral and Color Flow Doppler were utilized during            procedure). Indications:    R55 Syncope  History:        Patient has no prior history of Echocardiogram examinations.                 Signs/Symptoms:Syncope and Chest Pain. Elevated troponin.  Sonographer:    Sheralyn Boatman RDCS Referring Phys: [id number] Jonita Albee IMPRESSIONS  1. Left ventricular ejection fraction, by estimation, is 55 to 60%. The left ventricle has normal function. The left ventricle has no regional wall motion abnormalities. There is mild concentric left ventricular hypertrophy. Left ventricular diastolic parameters were normal. The average left ventricular global longitudinal strain is -14.3 %.  The global longitudinal strain is abnormal.  2. Right ventricular systolic function is low normal. The right ventricular size is normal.  3. The mitral valve is normal in structure. Trivial mitral valve regurgitation. No evidence of mitral stenosis.  4. The aortic valve is tricuspid. Aortic valve regurgitation is not visualized. No aortic stenosis is present.  5. The inferior vena cava is normal in size with greater than 50% respiratory variability, suggesting right atrial pressure of 3 mmHg. Comparison(s): No prior Echocardiogram. FINDINGS  Left Ventricle: Left ventricular ejection fraction, by estimation, is 55 to 60%. The left ventricle has normal function. The left ventricle has no regional wall motion abnormalities. The average left ventricular global longitudinal strain is -14.3 %. Strain was performed and the global longitudinal strain is abnormal. The left ventricular internal cavity size was small. There is mild concentric left ventricular hypertrophy. Left ventricular diastolic parameters were normal. Right Ventricle: The right ventricular size is normal. Right ventricular systolic function is low normal. Left Atrium: Left atrial size was normal in size. Right Atrium: Right atrial size was normal in size. Pericardium: Trivial pericardial effusion is present. Mitral Valve: The mitral valve is normal in structure. Trivial mitral valve regurgitation. No  evidence of mitral valve stenosis. Tricuspid Valve: The tricuspid valve is normal in structure. Tricuspid valve regurgitation is trivial. No evidence of tricuspid stenosis. Aortic Valve: The aortic valve is tricuspid. Aortic valve regurgitation is not visualized. No aortic stenosis is present. Pulmonic Valve: The pulmonic valve was normal in structure. Pulmonic valve regurgitation is trivial. No evidence of pulmonic stenosis. Aorta: The aortic root and ascending aorta are structurally normal, with no evidence of dilitation. Venous: The inferior vena cava is  normal in size with greater than 50% respiratory variability, suggesting right atrial pressure of 3 mmHg. IAS/Shunts: No atrial level shunt detected by color flow Doppler. Additional Comments: 3D was performed not requiring image post processing on an independent workstation and was normal.  LEFT VENTRICLE PLAX 2D LVIDd:         4.80 cm     Diastology LVIDs:         3.40 cm     LV e' medial:    13.20 cm/s LV PW:         1.40 cm     LV E/e' medial:  4.6 LV IVS:        1.10 cm     LV e' lateral:   12.60 cm/s LVOT diam:     2.30 cm     LV E/e' lateral: 4.8 LV SV:         66 LV SV Index:   33          2D Longitudinal Strain LVOT Area:     4.15 cm    2D Strain GLS Avg:     -14.3 %  LV Volumes (MOD) LV vol d, MOD A2C: 99.2 ml 3D Volume EF: LV vol d, MOD A4C: 86.0 ml 3D EF:        54 % LV vol s, MOD A2C: 53.5 ml LV vol s, MOD A4C: 40.3 ml LV SV MOD A2C:     45.7 ml LV SV MOD A4C:     86.0 ml LV SV MOD BP:      44.3 ml RIGHT VENTRICLE             IVC RV S prime:     10.30 cm/s  IVC diam: 1.80 cm TAPSE (M-mode): 2.3 cm LEFT ATRIUM             Index        RIGHT ATRIUM           Index LA diam:        2.60 cm 1.31 cm/m   RA Area:     12.80 cm LA Vol (A2C):   30.4 ml 15.28 ml/m  RA Volume:   29.40 ml  14.77 ml/m LA Vol (A4C):   22.6 ml 11.36 ml/m LA Biplane Vol: 27.4 ml 13.77 ml/m  AORTIC VALVE LVOT Vmax:   103.00 cm/s LVOT Vmean:  68.400 cm/s LVOT VTI:    0.160 m  AORTA Ao Root diam: 3.10 cm Ao Asc diam:  2.90 cm MITRAL VALVE MV Area (PHT): 4.35 cm    SHUNTS MV Decel Time: 175 msec    Systemic VTI:  0.16 m MV E velocity: 60.20 cm/s  Systemic Diam: 2.30 cm MV A velocity: 52.80 cm/s MV E/A ratio:  1.14 Olga Millers MD Electronically signed by Olga Millers MD Signature Date/Time: 11/05/2023/11:44:40 AM    Final    EEG adult Result Date: 11/05/2023 Charlsie Quest, MD     11/05/2023  8:43 AM Patient Name: Homero Fellers  Gu[place] MRN: [id number] Epilepsy Attending: Charlsie Quest Referring Physician/Provider: Faith Rogue,  DO Date: 11/04/2023 Duration: 23.06 mins Patient history: 38yo M with syncope. EEG to evaluate for seizure Level of alertness: Awake, asleep AEDs during EEG study: None Technical aspects: This EEG study was done with scalp electrodes positioned according to the 10-20 International system of electrode placement. Electrical activity was reviewed with band pass filter of 1-70Hz , sensitivity of 7 uV/mm, display speed of 50mm/sec with a 60Hz  notched filter applied as appropriate. EEG data were recorded continuously and digitally stored.  Video monitoring was available and reviewed as appropriate. Description: The posterior dominant rhythm consists of 9 Hz activity of moderate voltage (25-35 uV) seen predominantly in posterior head regions, symmetric and reactive to eye opening and eye closing. Sleep was characterized by vertex waves, sleep spindles (12 to 14 Hz), maximal frontocentral region. Hyperventilation and photic stimulation were not performed.   IMPRESSION: This study is within normal limits. No seizures or epileptiform discharges were seen throughout the recording. A normal interictal EEG does not exclude the diagnosis of epilepsy. Charlsie Quest   CT ABDOMEN PELVIS W CONTRAST Result Date: 11/04/2023 CLINICAL DATA:  Abdominal pain. EXAM: CT ABDOMEN AND PELVIS WITH CONTRAST TECHNIQUE: Multidetector CT imaging of the abdomen and pelvis was performed using the standard protocol following bolus administration of intravenous contrast. RADIATION DOSE REDUCTION: This exam was performed according to the departmental dose-optimization program which includes automated exposure control, adjustment of the mA and/or kV according to patient size and/or use of iterative reconstruction technique. CONTRAST:  75mL OMNIPAQUE IOHEXOL 350 MG/ML SOLN COMPARISON:  None Available. FINDINGS: Lower chest: The visualized lung bases are clear. No intra-abdominal free air or free fluid. Hepatobiliary: The liver is unremarkable. No  biliary dilatation. The gallbladder is unremarkable. Pancreas: Unremarkable. No pancreatic ductal dilatation or surrounding inflammatory changes. Spleen: Normal in size without focal abnormality. Adrenals/Urinary Tract: The adrenal glands unremarkable. Small left renal inferior pole cyst. There is no hydronephrosis on either side. There is symmetric enhancement and excretion of contrast by both kidneys. The visualized ureters and urinary bladder appear unremarkable. Stomach/Bowel: There is no bowel obstruction or active inflammation. The appendix is normal. Vascular/Lymphatic: The abdominal aorta and IVC are unremarkable. No portal venous gas. There is no adenopathy. Reproductive: The prostate and seminal vesicles are grossly unremarkable. No pelvic mass. Other: None Musculoskeletal: Mild diffuse subcutaneous edema. No fluid collection. No acute osseous pathology. IMPRESSION: 1. No acute intra-abdominal or pelvic pathology. 2. No bowel obstruction. Normal appendix. Electronically Signed   By: Elgie Collard M.D.   On: 11/04/2023 13:01   CT Head Wo Contrast Result Date: 11/04/2023 CLINICAL DATA:  Ataxia, head trauma EXAM: CT HEAD WITHOUT CONTRAST TECHNIQUE: Contiguous axial images were obtained from the base of the skull through the vertex without intravenous contrast. RADIATION DOSE REDUCTION: This exam was performed according to the departmental dose-optimization program which includes automated exposure control, adjustment of the mA and/or kV according to patient size and/or use of iterative reconstruction technique. COMPARISON:  MRI head July 06, 2016. FINDINGS: Brain: No evidence of acute infarction, hemorrhage, hydrocephalus, extra-axial collection or mass lesion/mass effect. Vascular: No hyperdense vessel. Skull: No acute fracture. Sinuses/Orbits: Clear sinuses.  No acute orbital findings. Other: No mastoid effusions. IMPRESSION: No evidence of acute intracranial abnormality. Electronically Signed    By: Feliberto Harts M.D.   On: 11/04/2023 12:55   DG Chest Portable 1 View Result Date: 11/04/2023 CLINICAL DATA:  Syncope EXAM: PORTABLE CHEST 1 VIEW COMPARISON:  May 25, 2020. FINDINGS: The cardiomediastinal silhouette is unchanged in contour. No pleural effusion. No pneumothorax. No acute pleuroparenchymal abnormality. IMPRESSION: No acute cardiopulmonary abnormality. Electronically Signed   By: Meda Klinefelter M.D.   On: 11/04/2023 11:04    Scheduled Meds:  pantoprazole  40 mg Oral Daily   Continuous Infusions:  iron sucrose       LOS: 1 day   Hughie Closs, MD [facility]  11/06/2023, 10:21 AM   *Please note that this is a verbal dictation therefore any spelling or grammatical errors are due to the "Dragon Medical One" system interpretation.  Please page via Amion and do not message via secure chat for urgent patient care matters. Secure chat can be used for non urgent patient care matters.  How to contact the [facility] Attending or Consulting provider 7A - 7P or covering provider during after hours 7P -7A, for this patient?  Check the care team in [facility] and look for a) attending/consulting [facility] provider listed and b) the [facility] team listed. Page or secure chat 7A-7P. Log into [email or web address] and use [facility]'s universal password to access. If you do not have the password, please contact the hospital operator. Locate the [facility] provider you are looking for under [facility] and page to a number that you can be directly reached. If you still have difficulty reaching the provider, please page the [facility] (Director on Call) for the Hospitalists listed on amion for assistance.

## 2023-11-06 NOTE — Evaluation (Signed)
 Physical Therapy Evaluation Patient Details Name: Robert Sanford MRN: [id number] DOB: May 19, 1986 Today's Date: 11/06/2023  History of Present Illness  Pt is 38 yo presenting to [facility] ED due to syncope requiring CPR. Pt reprots intermittent dark/black stools for the last several days. PMH: Allergies, anxiety, chest pain, genital herpes, high cholesterol, hyperlipidemia, low testosterone, myalgia, pituitary cyst, tinea versicolor.  Clinical Impression  Pt is presenting close to baseline level of function. Pt is ind or Mod I for all functional activities. Spouse is at bedside and supportive; pt has 24/7 assist at home if needed. Pt was negative for orthostatics and reports decreased dizziness/lightheadedness with activity. Increase in HR with upright positions with little to no increase in BP. Currently pt is presenting at baseline level of functioning and no skilled physical therapy services recommended. Pt will be discharged from skilled physical therapy services at this time; please re-consult if further needs arise.          If plan is discharge home, recommend the following: Assist for transportation;Assistance with cooking/housework     Equipment Recommendations None recommended by PT     Functional Status Assessment Patient has not had a recent decline in their functional status     Precautions / Restrictions Precautions Precautions: Fall Restrictions Weight Bearing Restrictions Per Provider Order: No      Mobility  Bed Mobility Overal bed mobility: Independent             General bed mobility comments: No issues.    Transfers Overall transfer level: Independent Equipment used: None      Ambulation/Gait Ambulation/Gait assistance: Modified independent (Device/Increase time) Gait Distance (Feet): 400 Feet Assistive device: None Gait Pattern/deviations: Step-through pattern, Decreased stride length Gait velocity: slightly decreased Gait velocity interpretation: 1.31 -  2.62 ft/sec, indicative of limited community ambulator   General Gait Details: slightly guarded.     Balance Overall balance assessment: Modified Independent           Pertinent Vitals/Pain Pain Assessment Pain Assessment: No/denies pain    Home Living Family/patient expects to be discharged to:: Private residence Living Arrangements: Spouse/significant other;Children (5 and 7) Available Help at Discharge: Family;Available 24 hours/day Type of Home: House Home Access: Stairs to enter Entrance Stairs-Rails: Doctor, general practice of Steps: 3   Home Layout: One level Home Equipment: None      Prior Function Prior Level of Function : Driving;Independent/Modified Independent;Working/employed             Mobility Comments: No issues. Denie falls. Has a physical job. ADLs Comments: Ind with ADL"s and IADL's.     Extremity/Trunk Assessment   Upper Extremity Assessment Upper Extremity Assessment: Overall WFL for tasks assessed    Lower Extremity Assessment Lower Extremity Assessment: Overall WFL for tasks assessed    Cervical / Trunk Assessment Cervical / Trunk Assessment: Normal  Communication   Communication Communication: No apparent difficulties    Cognition Arousal: Alert Behavior During Therapy: WFL for tasks assessed/performed   PT - Cognitive impairments: No apparent impairments       Following commands: Intact       Cueing Cueing Techniques: Verbal cues     General Comments General comments (skin integrity, edema, etc.): Supine: 110/57, HR: 88, sitting: 108/64 HR: 94, Standing: 107/59 HR 104  Standing 3 min: 113/60 HR 101        Assessment/Plan    PT Assessment Patient does not need any further PT services         PT Goals (Current  goals can be found in the Care Plan section)  Acute Rehab PT Goals PT Goal Formulation: All assessment and education complete, DC therapy     AM-PAC PT "6 Clicks" Mobility  Outcome Measure  Help needed turning from your back to your side while in a flat bed without using bedrails?: None Help needed moving from lying on your back to sitting on the side of a flat bed without using bedrails?: None Help needed moving to and from a bed to a chair (including a wheelchair)?: None Help needed standing up from a chair using your arms (e.g., wheelchair or bedside chair)?: None Help needed to walk in hospital room?: None Help needed climbing 3-5 steps with a railing? : A Little 6 Click Score: 23    End of Session Equipment Utilized During Treatment: Gait belt Activity Tolerance: Patient tolerated treatment well Patient left: in bed;with family/visitor present Nurse Communication: Mobility status      Time: 1610-9604 PT Time Calculation (min) (ACUTE ONLY): 38 min   Charges:   PT Evaluation $PT Eval Low Complexity: 1 Low PT Treatments $Therapeutic Activity: 23-37 mins PT General Charges $$ ACUTE PT VISIT: 1 Visit        Harrel Carina, DPT, CLT  Acute Rehabilitation Services Office: [phone] (Secure chat preferred)   Claudia Desanctis 11/06/2023, 11:21 AM

## 2023-11-06 NOTE — Progress Notes (Signed)
 Subjective: Feels better. No ongoing melena.  Objective: Vital signs in last 24 hours: Temp:  [98.1 F (36.7 C)-98.9 F (37.2 C)] 98.2 F (36.8 C) (02/22 1111) Pulse Rate:  [78-95] 79 (02/22 1111) Resp:  [13-22] 13 (02/22 0639) BP: (111-137)/(49-66) 111/49 (02/22 1111) SpO2:  [99 %] 99 % (02/22 0639) Weight:  [85 kg] 85 kg (02/22 0639) Weight change: -2.363 kg Last BM Date : 11/05/23  PE: GEN:  NAD ABD:  Soft, non-tender  Lab Results: CBC    Component Value Date/Time   WBC 10.0 11/06/2023 0211   RBC 2.27 (L) 11/06/2023 0211   HGB 7.2 (L) 11/06/2023 0211   HCT 21.7 (L) 11/06/2023 0211   PLT 191 11/06/2023 0211   MCV 95.6 11/06/2023 0211   MCH 31.7 11/06/2023 0211   MCHC 33.2 11/06/2023 0211   RDW 14.1 11/06/2023 0211   LYMPHSABS 2.4 11/04/2023 1038   MONOABS 1.4 (H) 11/04/2023 1038   EOSABS 0.1 11/04/2023 1038   BASOSABS 0.2 (H) 11/04/2023 1038  CMP     Component Value Date/Time   NA 140 11/06/2023 0211   K 4.0 11/06/2023 0211   CL 98 11/06/2023 0211   CO2 29 11/06/2023 0211   GLUCOSE 101 (H) 11/06/2023 0211   BUN 19 11/06/2023 0211   CREATININE 1.03 11/06/2023 0211   CALCIUM 8.6 (L) 11/06/2023 0211   PROT 4.1 (L) 11/05/2023 0214   ALBUMIN 2.3 (L) 11/05/2023 0214   AST 31 11/05/2023 0214   ALT 30 11/05/2023 0214   ALKPHOS 17 (L) 11/05/2023 0214   BILITOT 0.6 11/05/2023 0214   GFRNONAA >60 11/06/2023 0211    Assessment:   Melena with anemia.  Unrevealing endoscopy.  Plan:   Daily CBCs; transfuse as needed. Colonoscopy Monday for further evaluation; if this is unrevealing, then capsule endoscopy. Regular diet today ok, then clear liquids tomorrow. [facility] will follow.   [name] 11/06/2023, 2:32 PM   Cell [phone] If no answer or after 5 PM call [phone]

## 2023-11-07 DIAGNOSIS — K922 Gastrointestinal hemorrhage, unspecified: Secondary | ICD-10-CM | POA: Diagnosis not present

## 2023-11-07 LAB — CBC WITH DIFFERENTIAL/PLATELET
Abs Immature Granulocytes: 0 10*3/uL (ref 0.00–0.07)
Basophils Absolute: 0.4 10*3/uL — ABNORMAL HIGH (ref 0.0–0.1)
Basophils Relative: 2 %
Eosinophils Absolute: 0.5 10*3/uL (ref 0.0–0.5)
Eosinophils Relative: 3 %
HCT: 23.3 % — ABNORMAL LOW (ref 39.0–52.0)
Hemoglobin: 7.8 g/dL — ABNORMAL LOW (ref 13.0–17.0)
Lymphocytes Relative: 14 %
Lymphs Abs: 2.5 10*3/uL (ref 0.7–4.0)
MCH: 32.5 pg (ref 26.0–34.0)
MCHC: 33.5 g/dL (ref 30.0–36.0)
MCV: 97.1 fL (ref 80.0–100.0)
Monocytes Absolute: 0.4 10*3/uL (ref 0.1–1.0)
Monocytes Relative: 2 %
Neutro Abs: 13.8 10*3/uL — ABNORMAL HIGH (ref 1.7–7.7)
Neutrophils Relative %: 79 %
Platelets: 228 10*3/uL (ref 150–400)
RBC: 2.4 MIL/uL — ABNORMAL LOW (ref 4.22–5.81)
RDW: 14.5 % (ref 11.5–15.5)
WBC: 17.5 10*3/uL — ABNORMAL HIGH (ref 4.0–10.5)
nRBC: 5.3 % — ABNORMAL HIGH (ref 0.0–0.2)
nRBC: 6 /100{WBCs} — ABNORMAL HIGH

## 2023-11-07 MED ORDER — SODIUM CHLORIDE 0.9% FLUSH: 3.0000 mL | INTRAVENOUS | Status: DC | PRN

## 2023-11-07 MED ORDER — NA SULFATE-K SULFATE-MG SULF 17.5-3.13-1.6 GM/177ML PO SOLN
0.5000 | Freq: Once | ORAL | Status: AC
  Administered 2023-11-07: 177 mL via ORAL
  Filled 2023-11-07: qty 1

## 2023-11-07 MED ORDER — NA SULFATE-K SULFATE-MG SULF 17.5-3.13-1.6 GM/177ML PO SOLN
0.5000 | Freq: Once | ORAL | Status: AC
  Administered 2023-11-08: 177 mL via ORAL

## 2023-11-07 MED ORDER — SODIUM CHLORIDE 0.9% FLUSH
3.0000 mL | Freq: Two times a day (BID) | INTRAVENOUS | Status: DC
  Administered 2023-11-07: 10 mL via INTRAVENOUS
  Administered 2023-11-07 – 2023-11-08 (×2): 3 mL via INTRAVENOUS
  Administered 2023-11-09: 10 mL via INTRAVENOUS

## 2023-11-07 MED ORDER — BISACODYL 10 MG RE SUPP
10.0000 mg | Freq: Once | RECTAL | Status: AC
  Administered 2023-11-07: 10 mg via RECTAL
  Filled 2023-11-07: qty 1

## 2023-11-07 MED ORDER — PEG 3350-KCL-NA BICARB-NACL 420 G PO SOLR: 4000.0000 mL | Freq: Once | ORAL | Status: DC

## 2023-11-07 NOTE — Plan of Care (Signed)
  Problem: Health Behavior/Discharge Planning: Goal: Ability to manage health-related needs will improve Outcome: Progressing   Problem: Clinical Measurements: Goal: Ability to maintain clinical measurements within normal limits will improve Outcome: Progressing   Problem: Clinical Measurements: Goal: Will remain free from infection Outcome: Progressing   Problem: Fluid Volume: Goal: Will show no signs and symptoms of excessive bleeding Outcome: Progressing   Problem: Bowel/Gastric: Goal: Will show no signs and symptoms of gastrointestinal bleeding Outcome: Progressing   Problem: Clinical Measurements: Goal: Complications related to the disease process, condition or treatment will be avoided or minimized Outcome: Progressing

## 2023-11-07 NOTE — Plan of Care (Signed)
 Colonoscopy planned tomorrow for evaluation of blood in stool, melena, unrevealing endoscopy.  Clear liquids today ok.

## 2023-11-07 NOTE — Anesthesia Preprocedure Evaluation (Signed)
 Anesthesia Evaluation  Patient identified by MRN, date of birth, ID band Patient awake    Reviewed: Allergy & Precautions, H&P , NPO status , Patient's Chart, lab work & pertinent test results  Airway Mallampati: II  TM Distance: >3 FB Neck ROM: Full    Dental no notable dental hx.    Pulmonary former smoker   Pulmonary exam normal breath sounds clear to auscultation       Cardiovascular negative cardio ROS Normal cardiovascular exam Rhythm:Regular Rate:Normal  Echo 11/05/2023  1. Left ventricular ejection fraction, by estimation, is 55 to 60%. The left ventricle has normal function. The left ventricle has no regional wall motion abnormalities. There is mild concentric left ventricular hypertrophy. Left ventricular diastolic parameters were normal. The average left ventricular global longitudinal strain is -14.3 %. The global longitudinal strain is abnormal.   2. Right ventricular systolic function is low normal. The right ventricular size is normal.   3. The mitral valve is normal in structure. Trivial mitral valve regurgitation. No evidence of mitral stenosis.   4. The aortic valve is tricuspid. Aortic valve regurgitation is not visualized. No aortic stenosis is present.   5. The inferior vena cava is normal in size with greater than 50% respiratory variability, suggesting right atrial pressure of 3 mmHg.   Comparison(s): No prior Echocardiogram.     Neuro/Psych   Anxiety        GI/Hepatic Neg liver ROS,,,melena   Endo/Other  negative endocrine ROS    Renal/GU negative Renal ROS     Musculoskeletal   Abdominal   Peds  Hematology  (+) Blood dyscrasia, anemia Hemoglobin 7.9   Anesthesia Other Findings   Reproductive/Obstetrics                             Anesthesia Physical Anesthesia Plan  ASA: 2  Anesthesia Plan: MAC   Post-op Pain Management: Minimal or no pain anticipated    Induction: Intravenous  PONV Risk Score and Plan: 1 and Propofol infusion, Treatment may vary due to age or medical condition and TIVA  Airway Management Planned: Natural Airway  Additional Equipment:   Intra-op Plan:   Post-operative Plan:   Informed Consent: I have reviewed the patients History and Physical, chart, labs and discussed the procedure including the risks, benefits and alternatives for the proposed anesthesia with the patient or authorized representative who has indicated his/her understanding and acceptance.     Dental advisory given  Plan Discussed with: CRNA  Anesthesia Plan Comments:        Anesthesia Quick Evaluation

## 2023-11-07 NOTE — Progress Notes (Signed)
 PROGRESS NOTE  Robert Sanford  DOB: Sep 09, 1986  PCP: Sheliah Hatch, PA-C OZH:[id number]  DOA: 11/04/2023  LOS: 1 day  Hospital Day: 4  Brief narrative: Robert Sanford is a 38 y.o. 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 2/20, patient was brought to the ED for evaluation of syncopal episode.   Patient lives at home with his wife.  He always does strenuous routines at the gym twice a day.  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 He takes ibuprofen for back pain.  2/6, seen by cardiology Dr. Lynnette Caffey as an outpatient for chest pain, suspected to be musculoskeletal in nature.  Stress echo was scheduled but patient later canceled.  2 weeks ago, patient noted dark stool.   2/20, he states her nausea and went to the bathroom to throw up, felt like having a bowel movement and next thing he remembers is his wife doing CPR on him.  His wife heard him fall and came over to help him immediately.  It is unclear if patient lost any pulse.  When he woke up, he was able to talk coherent and was back to baseline.  Does not recall any premonition symptoms before collapsing.  EMS was called and brought to the ED  In the ED, initially tachycardic Labs with hemoglobin 11, FOBT positive  CK13 182, potassium 6.7, creatinine 1.39, lactic acid 3.5 Troponin 81> 151 Chest x-ray without any acute abnormality  Admitted with possible sepsis and rhabdomyolysis GI, cardiology were consulted.  Subjective: Patient was seen and examined this morning.  Pleasant young male.  Lying down in bed.  Not in distress.  Wife at bedside.  Still having black stool.  Hemoglobin stable though.   Felt a little dizzy on walking earlier. In the last 24 hours, afebrile, heart rate in 70s, blood pressure in 120s Most recent labs from this morning with hemoglobin 7.5  Assessment and plan: Acute blood loss anemia Initially had a hemoglobin of 11 with FOBT  positive. Within 24 hours, hemoglobin dropped down to 7.9. Reportedly was taking ibuprofen intermittently for musculoskeletal pain.  Reported few episodes of melena/black stool in last 2 weeks. GI consulted. 2/21, underwent EGD by Dr. Pati Gallo which did not show any evidence of active bleeding, showed erythematous mucosa in the antrum, biopsied.  Per patient's wife, colonoscopy is planned for tomorrow.  Patient has not required any blood transfusion so far. Ferritin low at 49.  Serum iron level low. Getting IV iron this morning.  Called Continue Protonix Recent Labs    11/05/23 1405 11/05/23 2027 11/06/23 0211 11/06/23 1631 11/07/23 0951  HGB 7.2* 7.3* 7.2* 7.5* 7.8*  MCV 96.5 95.7 95.6 97.0 97.1  FERRITIN  --   --  49  --   --   TIBC  --   --  354  --   --   IRON  --   --  30*  --   --    Syncope Suspected to be secondary to hypotension from GI bleeding. Echo 2/21 with EF 55 to 60%, no WMA, trivial mitral regurg  Elevated troponin Troponin peaked at 151 Likely demand ischemia due to hypotension Echo without WMA No further ischemia workup needed per cardiology Recent Labs    11/04/23 1234 11/05/23 0214 11/05/23 0919 11/05/23 1155 11/06/23 0211  CKTOTAL 1,382* 758*  --   --  325  TROPONINIHS 151*  --  71* 55*  --    Slightly  elevated CK Does not quite meet criteria for rhabdomyolysis.  CK improving. Recent Labs  Lab 11/04/23 1234 11/05/23 0214 11/06/23 0211  CKTOTAL 1,382* 758* 325    Lactic acidosis Likely due to GI bleed.  Lactic acidosis resolved. Recent Labs  Lab 11/04/23 1957 11/04/23 2207 11/05/23 0919  LATICACIDVEN 2.0* 2.1* 1.9     Hyperkalemia Resolved Recent Labs  Lab 11/04/23 1038 11/04/23 1539 11/04/23 2207 11/05/23 0214 11/06/23 0211  K 6.7* 5.1  --  3.8 4.0  MG  --   --  1.7  --  1.8      Mobility: Encourage ambulation  Goals of care   Code Status: Full Code     DVT prophylaxis:  Place and maintain sequential compression device  Start: 11/04/23 1623 SCDs Start: 11/04/23 1502   Antimicrobials: None Fluid: None Consultants: GI Family Communication: Wife at bedside  Status: Observation Level of care:  Progressive   Patient is from: Home Needs to continue in-hospital care: Colonoscopy tomorrow Anticipated d/c to: Hopefully home in 1 to 2 days      Diet:  Diet Order             Diet clear liquid Room service appropriate? Yes; Fluid consistency: Thin  Diet effective now                   Scheduled Meds:  pantoprazole  40 mg Oral Daily    PRN meds:    Infusions:   iron sucrose 500 mg (11/07/23 0957)    Antimicrobials: Anti-infectives (From admission, onward)    Start     Dose/Rate Route Frequency Ordered Stop   11/04/23 1400  vancomycin (VANCOREADY) IVPB 1750 mg/350 mL        1,750 mg 175 mL/hr over 120 Minutes Intravenous  Once 11/04/23 1349 11/04/23 1805   11/04/23 1245  piperacillin-tazobactam (ZOSYN) IVPB 3.375 g        3.375 g 100 mL/hr over 30 Minutes Intravenous  Once 11/04/23 1230 11/04/23 1612       Objective: Vitals:   11/07/23 0724 11/07/23 1140  BP: (!) 121/55 (!) 117/51  Pulse: 76 87  Resp: 15   Temp: 98 F (36.7 C) 97.8 F (36.6 C)  SpO2:      Intake/Output Summary (Last 24 hours) at 11/07/2023 1202 Last data filed at 11/06/2023 2027 Gross per 24 hour  Intake 755 ml  Output --  Net 755 ml   Filed Weights   11/05/23 0428 11/06/23 0639 11/07/23 0500  Weight: 87.4 kg 85 kg 87.3 kg   Weight change: 2.3 kg Body mass index is 30.14 kg/m.   Physical Exam: General exam: Pleasant, young male.  Not in distress Skin: No rashes, lesions or ulcers. HEENT: Atraumatic, normocephalic, no obvious bleeding Lungs: Clear to auscultation bilaterally,  CVS: S1, S2, no murmur,   GI/Abd: Soft, nontender, nondistended, bowel sound present,   CNS: Alert, awake, oriented x 3 Psychiatry: Mood appropriate,  Extremities: No pedal edema, no calf tenderness,   Data Review:  I have personally reviewed the laboratory data and studies available.  F/u labs ordered Unresulted Labs (From admission, onward)     Start     Ordered   11/08/23 0500  Basic metabolic panel  Daily,   R     Question:  Specimen collection method  Answer:  Lab=Lab collect   11/07/23 1202   11/08/23 0500  CBC with Differential/Platelet  Daily,   R     Question:  Specimen collection  method  Answer:  Lab=Lab collect   11/07/23 1202            Total time spent in review of labs and imaging, patient evaluation, formulation of plan, documentation and communication with family: 55 minutes  Signed, Lorin Glass, MD [facility] 11/07/2023

## 2023-11-07 NOTE — Evaluation (Signed)
 Occupational Therapy Evaluation and DC Summary  Patient Details Name: Robert Sanford MRN: [id number] DOB: 12/19/1985 Today's Date: 11/07/2023   History of Present Illness   Pt is 38 yo presenting to [facility] ED due to syncope requiring CPR. Pt reprots intermittent dark/black stools for the last several days. PMH: Allergies, anxiety, chest pain, genital herpes, high cholesterol, hyperlipidemia, low testosterone, myalgia, pituitary cyst, tinea versicolor.     Clinical Impressions Pt admitted for above, PTA pt was independent in mobility and ADLs/iADLs, working for [employer]. Pt currently presenting close to baseline, completing ADLs and mobility ind no AD. Educated pt/spouse on edema management and where to check for pulses. Pt has no further acute skilled OT needs, no post acute OT recommended.      If plan is discharge home, recommend the following:   Other (comment) (prn)     Functional Status Assessment   Patient has not had a recent decline in their functional status     Equipment Recommendations   None recommended by OT     Recommendations for Other Services         Precautions/Restrictions   Precautions Precautions: Fall (low fall risk) Recall of Precautions/Restrictions: Intact Restrictions Weight Bearing Restrictions Per Provider Order: No     Mobility Bed Mobility Overal bed mobility: Independent             General bed mobility comments: No issues.    Transfers Overall transfer level: Independent Equipment used: None                      Balance Overall balance assessment: Modified Independent                                         ADL either performed or assessed with clinical judgement   ADL Overall ADL's : Independent                                       General ADL Comments: Educated pt on the valsalva maneuver, upon further investigation pt noted that he was not having a BM when he passed out.  Discussed diet, his diet was high protein, talked about increasing fiber uptake. Educated pt and spouse on locations to find a pulse if needing to assess a situation prior to performing CPR. Educated pt on edema management with elevation, edema massage, and AROM     Vision Patient Visual Report: No change from baseline       Perception         Praxis         Pertinent Vitals/Pain Pain Assessment Pain Assessment: No/denies pain     Extremity/Trunk Assessment Upper Extremity Assessment Upper Extremity Assessment: Overall WFL for tasks assessed (increased edema in bilat hands)   Lower Extremity Assessment Lower Extremity Assessment: Overall WFL for tasks assessed (increased edema in bilat feet)   Cervical / Trunk Assessment Cervical / Trunk Assessment: Normal   Communication Communication Communication: No apparent difficulties   Cognition Arousal: Alert Behavior During Therapy: WFL for tasks assessed/performed Cognition: No apparent impairments                               Following commands: Intact       Cueing  General Comments   Cueing Techniques: Verbal cues      Exercises     Shoulder Instructions      Home Living Family/patient expects to be discharged to:: Private residence Living Arrangements: Spouse/significant other;Children Available Help at Discharge: Family;Available 24 hours/day Type of Home: House Home Access: Stairs to enter [employer] of Steps: 3 Entrance Stairs-Rails: Right;Left Home Layout: One level     Bathroom Shower/Tub: Producer, television/film/video: Standard Bathroom Accessibility: No   Home Equipment: None          Prior Functioning/Environment Prior Level of Function : Driving;Independent/Modified Independent;Working/employed             Mobility Comments: No issues. Denies falls. Has a physical job. ADLs Comments: Ind with ADL"s and IADL's.    OT Problem List: Increased edema    OT Treatment/Interventions:        OT Goals(Current goals can be found in the care plan section)   Acute Rehab OT Goals Patient Stated Goal: to fix stomach issues OT Goal Formulation: With patient Time For Goal Achievement: 11/21/23 Potential to Achieve Goals: Good   OT Frequency:       Co-evaluation              AM-PAC OT "6 Clicks" Daily Activity     Outcome Measure Help from another person eating meals?: None Help from another person taking care of personal grooming?: None Help from another person toileting, which includes using toliet, bedpan, or urinal?: None Help from another person bathing (including washing, rinsing, drying)?: None Help from another person to put on and taking off regular upper body clothing?: None Help from another person to put on and taking off regular lower body clothing?: None 6 Click Score: 24   End of Session Nurse Communication: Mobility status  Activity Tolerance: Patient tolerated treatment well Patient left: in bed;with family/visitor present;with call bell/phone within reach  OT Visit Diagnosis: Other (comment) (syncopal episode)                Time: 1012-1029 OT Time Calculation (min): 17 min Charges:  OT General Charges $OT Visit: 1 Visit OT Evaluation $OT Eval Low Complexity: 1 Low  11/07/2023  AB, OTR/L  Acute Rehabilitation Services  Office: [phone]   Tristan Schroeder 11/07/2023, 11:49 AM

## 2023-11-07 NOTE — Plan of Care (Signed)
  Problem: Education: Goal: Knowledge of General Education information will improve Description: Including pain rating scale, medication(s)/side effects and non-pharmacologic comfort measures Outcome: Progressing   Problem: Activity: Goal: Risk for activity intolerance will decrease Outcome: Progressing   Problem: Fluid Volume: Goal: Will show no signs and symptoms of excessive bleeding Outcome: Progressing

## 2023-11-08 ENCOUNTER — Observation Stay (HOSPITAL_BASED_OUTPATIENT_CLINIC_OR_DEPARTMENT_OTHER): Payer: Commercial Managed Care - PPO | Admitting: Anesthesiology

## 2023-11-08 ENCOUNTER — Encounter (HOSPITAL_COMMUNITY)
Admission: EM | Disposition: A | Discharge status: Home / Self Care | Payer: Self-pay | Source: Home / Self Care | Attending: Internal Medicine

## 2023-11-08 ENCOUNTER — Observation Stay (HOSPITAL_COMMUNITY): Payer: Commercial Managed Care - PPO | Admitting: Anesthesiology

## 2023-11-08 ENCOUNTER — Encounter (HOSPITAL_COMMUNITY): Payer: Self-pay | Admitting: Internal Medicine

## 2023-11-08 DIAGNOSIS — K922 Gastrointestinal hemorrhage, unspecified: Secondary | ICD-10-CM | POA: Diagnosis not present

## 2023-11-08 DIAGNOSIS — D62 Acute posthemorrhagic anemia: Secondary | ICD-10-CM | POA: Diagnosis not present

## 2023-11-08 HISTORY — PX: COLONOSCOPY WITH PROPOFOL: SHX5780

## 2023-11-08 HISTORY — PX: GIVENS CAPSULE STUDY: SHX5432

## 2023-11-08 LAB — CBC WITH DIFFERENTIAL/PLATELET
Abs Immature Granulocytes: 0 10*3/uL (ref 0.00–0.07)
Basophils Absolute: 0 10*3/uL (ref 0.0–0.1)
Basophils Relative: 0 %
Eosinophils Absolute: 0.4 10*3/uL (ref 0.0–0.5)
Eosinophils Relative: 2 %
HCT: 24.6 % — ABNORMAL LOW (ref 39.0–52.0)
Hemoglobin: 8 g/dL — ABNORMAL LOW (ref 13.0–17.0)
Lymphocytes Relative: 12 %
Lymphs Abs: 2.4 10*3/uL (ref 0.7–4.0)
MCH: 31.7 pg (ref 26.0–34.0)
MCHC: 32.5 g/dL (ref 30.0–36.0)
MCV: 97.6 fL (ref 80.0–100.0)
Monocytes Absolute: 0.6 10*3/uL (ref 0.1–1.0)
Monocytes Relative: 3 %
Neutro Abs: 16.3 10*3/uL — ABNORMAL HIGH (ref 1.7–7.7)
Neutrophils Relative %: 83 %
Platelets: 268 10*3/uL (ref 150–400)
RBC: 2.52 MIL/uL — ABNORMAL LOW (ref 4.22–5.81)
RDW: 14.9 % (ref 11.5–15.5)
WBC: 19.6 10*3/uL — ABNORMAL HIGH (ref 4.0–10.5)
nRBC: 12 /100{WBCs} — ABNORMAL HIGH
nRBC: 7.6 % — ABNORMAL HIGH (ref 0.0–0.2)

## 2023-11-08 LAB — BASIC METABOLIC PANEL
Anion gap: 9 (ref 5–15)
BUN: 14 mg/dL (ref 6–20)
CO2: 28 mmol/L (ref 22–32)
Calcium: 7.6 mg/dL — ABNORMAL LOW (ref 8.9–10.3)
Chloride: 100 mmol/L (ref 98–111)
Creatinine, Ser: 1.05 mg/dL (ref 0.61–1.24)
GFR, Estimated: >60 mL/min (ref >60)
Glucose, Bld: 93 mg/dL (ref 70–99)
Potassium: 4 mmol/L (ref 3.5–5.1)
Sodium: 137 mmol/L (ref 135–145)

## 2023-11-08 LAB — SURGICAL PATHOLOGY

## 2023-11-08 SURGERY — COLONOSCOPY WITH PROPOFOL
Anesthesia: Monitor Anesthesia Care | Laterality: Left

## 2023-11-08 SURGERY — IMAGING PROCEDURE, GI TRACT, INTRALUMINAL, VIA CAPSULE
Anesthesia: LOCAL

## 2023-11-08 MED ORDER — PHENYLEPHRINE 80 MCG/ML (10ML) SYRINGE FOR IV PUSH (FOR BLOOD PRESSURE SUPPORT)
PREFILLED_SYRINGE | INTRAVENOUS | Status: DC | PRN
Start: 2023-11-08 — End: 2023-11-08
  Administered 2023-11-08 (×6): 160 ug via INTRAVENOUS

## 2023-11-08 MED ORDER — HYDROMORPHONE HCL 1 MG/ML IJ SOLN
0.5000 mg | Freq: Once | INTRAMUSCULAR | Status: AC
  Administered 2023-11-08: 0.5 mg via INTRAVENOUS
  Filled 2023-11-08: qty 0.5

## 2023-11-08 MED ORDER — SODIUM CHLORIDE 0.9 % IV SOLN
INTRAVENOUS | Status: DC | PRN
Start: 2023-11-08 — End: 2023-11-08

## 2023-11-08 MED ORDER — SODIUM CHLORIDE 0.9 % IV SOLN
500.0000 mg | INTRAVENOUS | Status: DC
  Administered 2023-11-08: 500 mg via INTRAVENOUS
  Filled 2023-11-08: qty 500
  Filled 2023-11-08: qty 25

## 2023-11-08 MED ORDER — EPHEDRINE SULFATE-NACL 50-0.9 MG/10ML-% IV SOSY
PREFILLED_SYRINGE | INTRAVENOUS | Status: DC | PRN
  Administered 2023-11-08: 10 mg via INTRAVENOUS

## 2023-11-08 MED ORDER — PROPOFOL 10 MG/ML IV BOLUS
INTRAVENOUS | Status: DC | PRN
  Administered 2023-11-08 (×2): 50 mg via INTRAVENOUS
  Administered 2023-11-08 (×2): 20 mg via INTRAVENOUS
  Administered 2023-11-08: 80 mg via INTRAVENOUS
  Administered 2023-11-08: 20 mg via INTRAVENOUS
  Administered 2023-11-08: 50 mg via INTRAVENOUS

## 2023-11-08 SURGICAL SUPPLY — 1 items: TOWEL COTTON PACK 4EA (MISCELLANEOUS) ×2 IMPLANT

## 2023-11-08 SURGICAL SUPPLY — 20 items
ELECT REM PT RETURN 9FT ADLT (ELECTROSURGICAL) IMPLANT
ELECTRODE REM PT RTRN 9FT ADLT (ELECTROSURGICAL) IMPLANT
FLOOR PAD 36X40 (MISCELLANEOUS) ×1 IMPLANT
FORCEPS BIOP RAD 4 LRG CAP 4 (CUTTING FORCEPS) IMPLANT
FORCEPS BIOP RJ4 240 W/NDL (CUTTING FORCEPS) IMPLANT
FORCEPS BXJMBJMB 240X2.8X (CUTTING FORCEPS) IMPLANT
INJECTOR/SNARE I SNARE (MISCELLANEOUS) IMPLANT
LUBRICANT JELLY 4.5OZ STERILE (MISCELLANEOUS) IMPLANT
MANIFOLD NEPTUNE II (INSTRUMENTS) IMPLANT
NDL SCLEROTHERAPY 25GX240 (NEEDLE) IMPLANT
NEEDLE SCLEROTHERAPY 25GX240 (NEEDLE) IMPLANT
PAD FLOOR 36X40 (MISCELLANEOUS) ×1 IMPLANT
PROBE APC STR FIRE (PROBE) IMPLANT
PROBE INJECTION GOLD 7FR (MISCELLANEOUS) IMPLANT
SNARE ROTATE MED OVAL 20MM (MISCELLANEOUS) IMPLANT
SYR 50ML LL SCALE MARK (SYRINGE) IMPLANT
TRAP SPECIMEN MUCOUS 40CC (MISCELLANEOUS) IMPLANT
TUBING ENDO SMARTCAP PENTAX (MISCELLANEOUS) IMPLANT
TUBING IRRIGATION ENDOGATOR (MISCELLANEOUS) ×1 IMPLANT
WATER STERILE IRR 1000ML POUR (IV SOLUTION) IMPLANT

## 2023-11-08 NOTE — Progress Notes (Signed)
 Patient swallowed pillcam today at 11:30am without difficulty. He is to remain NPO for 2 hours. At 1:30pm, he can have clear liquids (nothing red). At 3:30pm, patient can have a light snack. At 7:30pm, he can return to previously ordered diet. The belt and monitor can be removed at 11:30 pm and placed in the patient belonging bag for pick up by Endo staff in the morning.

## 2023-11-08 NOTE — Interval H&P Note (Signed)
 History and Physical Interval Note:  11/08/2023 9:01 AM  Robert Sanford  has presented today for surgery, with the diagnosis of blood in stool, anemia.  The various methods of treatment have been discussed with the patient and family. After consideration of risks, benefits and other options for treatment, the patient has consented to  Procedure(s): COLONOSCOPY WITH PROPOFOL (Left) as a surgical intervention.  The patient's history has been reviewed, patient examined, no change in status, stable for surgery.  I have reviewed the patient's chart and labs.  Questions were answered to the patient's satisfaction.     [name]

## 2023-11-08 NOTE — Op Note (Signed)
 [facility] Patient Name: Robert Sanford Procedure Date : 11/08/2023 MRN: [id number] Attending MD: Willis Modena , MD, [id number] Date of Birth: [birth year] CSN: [id number] Age: 38 Admit Type: Inpatient Procedure:                Colonoscopy Indications:              This is the patient's first colonoscopy, Melena,                            Acute post hemorrhagic anemia Providers:                Willis Modena, MD, Jacquelyn "Jaci" Clelia Croft, RN,                            Rozetta Nunnery, Technician Referring MD:             [facility] Medicines:                Monitored Anesthesia Care Complications:            No immediate complications. Estimated Blood Loss:     Estimated blood loss: none. Procedure:                Pre-Anesthesia Assessment:                           - Prior to the procedure, a History and Physical                            was performed, and patient medications and                            allergies were reviewed. The patient's tolerance of                            previous anesthesia was also reviewed. The risks                            and benefits of the procedure and the sedation                            options and risks were discussed with the patient.                            All questions were answered, and informed consent                            was obtained. Prior Anticoagulants: The patient has                            taken no anticoagulant or antiplatelet agents. ASA                            Grade Assessment: II - A patient with mild systemic  disease. After reviewing the risks and benefits,                            the patient was deemed in satisfactory condition to                            undergo the procedure.                           After obtaining informed consent, the colonoscope                            was passed under direct vision. Throughout the                             procedure, the patient's blood pressure, pulse, and                            oxygen saturations were monitored continuously. The                            PCF-HQ190TL ([id number] Olympus peds colonoscope was                            introduced through the anus and advanced to the the                            terminal ileum, with identification of the                            appendiceal orifice and IC valve. The terminal                            ileum, ileocecal valve, appendiceal orifice, and                            rectum were photographed. The entire colon was                            examined. The colonoscopy was performed without                            difficulty. The patient tolerated the procedure                            well. The quality of the bowel preparation was                            adequate. Scope In: 9:13:07 AM Scope Out: 9:33:13 AM Scope Withdrawal Time: 0 hours 14 minutes 53 seconds  Total Procedure Duration: 0 hours 20 minutes 6 seconds  Findings:      The perianal and digital rectal examinations were normal.      The colon (entire examined portion) appeared normal.      The terminal ileum  appeared normal.      The retroflexed view of the distal rectum and anal verge was normal and       showed no anal or rectal abnormalities.      No old/fresh blood seen to the extent of our examination. Impression:               - The entire examined colon is normal.                           - The examined portion of the ileum was normal.                           - The distal rectum and anal verge are normal on                            retroflexion view.                           - No explanation for melena/anemia identified. Recommendation:           - Return patient to hospital ward for ongoing care.                           - Could consider capsule endoscopy as inpatient;                            will keep patient NPO for now until he decides                             whether or not to have capsule (could alternatively                            have outpatient follow-up of CBC and consider                            outpatient capsule endoscopy, as there does not                            appear to be any ongoing active bleeding).                           - Continue present medications.                           [facility] will follow. Procedure Code(s):        --- Professional ---                           [phone], Colonoscopy, flexible; diagnostic, including                            collection of specimen(s) by brushing or washing,                            when performed (separate procedure) Diagnosis Code(s):        ---  Professional ---                           K92.1, Melena (includes Hematochezia)                           D62, Acute posthemorrhagic anemia CPT copyright 2022 American [employer] Association. All rights reserved. The codes documented in this report are preliminary and upon coder review may  be revised to meet current compliance requirements. Willis Modena, MD 11/08/2023 9:52:50 AM This report has been signed electronically. Number of Addenda: 0

## 2023-11-08 NOTE — Progress Notes (Signed)
 Patient continues to have pain in left forearm from iv infiltration in the afternoon.  Provider notified as patient asking about an ultrasound of the arm. One time dose of dilaudid and ultrasound of arm ordered.

## 2023-11-08 NOTE — Anesthesia Postprocedure Evaluation (Signed)
 Anesthesia Post Note  Patient: Robert Sanford  Procedure(s) Performed: ESOPHAGOGASTRODUODENOSCOPY (EGD) WITH PROPOFOL BIOPSY     Patient location during evaluation: Endoscopy Anesthesia Type: MAC Level of consciousness: awake and alert Pain management: pain level controlled Vital Signs Assessment: post-procedure vital signs reviewed and stable Respiratory status: spontaneous breathing, nonlabored ventilation, respiratory function stable and patient connected to nasal cannula oxygen Cardiovascular status: stable and blood pressure returned to baseline Postop Assessment: no apparent nausea or vomiting Anesthetic complications: no   No notable events documented.  Last Vitals:  Vitals:   11/08/23 0950 11/08/23 1023  BP: (!) 107/54   Pulse: 86   Resp: (!) 21   Temp:  (P) 36.7 C  SpO2: 99%     Last Pain:  Vitals:   11/08/23 1023  TempSrc: (P) Oral  PainSc:                  Klaryssa Fauth S

## 2023-11-08 NOTE — Transfer of Care (Signed)
 Immediate Anesthesia Transfer of Care Note  Patient: Robert Sanford  Procedure(s) Performed: COLONOSCOPY WITH PROPOFOL (Left)  Patient Location: PACU  Anesthesia Type:MAC  Level of Consciousness: awake, alert , and oriented  Airway & Oxygen Therapy: Patient Spontanous Breathing and Patient connected to nasal cannula oxygen  Post-op Assessment: Report given to RN and Post -op Vital signs reviewed and stable  Post vital signs: Reviewed and stable  Last Vitals:  Vitals Value Taken Time  BP 104/52 11/08/23 0943  Temp 36.7 C 11/08/23 0936  Pulse 88 11/08/23 0945  Resp 13 11/08/23 0945  SpO2 99 % 11/08/23 0945  Vitals shown include unfiled device data.  Last Pain:  Vitals:   11/08/23 0936  TempSrc: Temporal  PainSc:          Complications: No notable events documented.

## 2023-11-08 NOTE — Plan of Care (Signed)

## 2023-11-08 NOTE — Anesthesia Postprocedure Evaluation (Signed)
 Anesthesia Post Note  Patient: Robert Sanford  Procedure(s) Performed: COLONOSCOPY WITH PROPOFOL (Left)     Patient location during evaluation: Endoscopy Anesthesia Type: MAC Level of consciousness: awake and alert Pain management: pain level controlled Vital Signs Assessment: post-procedure vital signs reviewed and stable Respiratory status: spontaneous breathing Cardiovascular status: stable Anesthetic complications: no   No notable events documented.  Last Vitals:  Vitals:   11/08/23 0950 11/08/23 1023  BP: (!) 107/54   Pulse: 86   Resp: (!) 21   Temp:  (P) 36.7 C  SpO2: 99%     Last Pain:  Vitals:   11/08/23 1023  TempSrc: (P) Oral  PainSc:                  [name]

## 2023-11-08 NOTE — Progress Notes (Signed)
 PROGRESS NOTE  Robert Sanford  DOB: 1986/01/13  PCP: Robert Hatch, PA-C [id number]  DOA: 11/04/2023  LOS: 1 day  Hospital Day: 5  Brief narrative: Robert Sanford is a 38 y.o. 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 2/20, patient was brought to the ED for evaluation of syncopal episode.   Patient lives at home with his wife.  He always does strenuous routines at the gym twice a day.  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 He takes ibuprofen for back pain.  2/6, seen by cardiology Dr. Lynnette Caffey as an outpatient for chest pain, suspected to be musculoskeletal in nature.  Stress echo was scheduled but patient later canceled.  2 weeks ago, patient noted dark stool.   2/20, he states her nausea and went to the bathroom to throw up, felt like having a bowel movement and next thing he remembers is his wife doing CPR on him.  His wife heard him fall and came over to help him immediately.  It is unclear if patient lost any pulse.  When he woke up, he was able to talk coherent and was back to baseline.  Does not recall any premonition symptoms before collapsing.  EMS was called and brought to the ED  In the ED, initially tachycardic Labs with hemoglobin 11, FOBT positive  CK13 182, potassium 6.7, creatinine 1.39, lactic acid 3.5 Troponin 81> 151 Chest x-ray without any acute abnormality  Admitted with possible sepsis and rhabdomyolysis GI, cardiology were consulted.  Subjective: Patient was seen and examined this morning.   Wife at bedside.  Underwent colonoscopy this morning.  Normal per report Labs from this morning showed hemoglobin of 8.  Assessment and plan: Acute blood loss anemia Initially had a hemoglobin of 11 with FOBT positive. Within 24 hours, hemoglobin dropped down to 7.9. Reportedly was taking ibuprofen intermittently for musculoskeletal pain.  Reported few episodes of melena/black stool in  last 2 weeks. GI consulted. 2/21, underwent EGD by Dr. Marca Ancona which did not show any evidence of active bleeding, showed erythematous mucosa in the antrum, biopsied.   2/24, colonoscopy was normal.  Tentative plan of capsule endoscopy per GI. patient has not required any blood transfusion so far. Ferritin low at 49.  Serum iron level low.  IV iron infusion was given.  Repeat iron infusion today. Continue Protonix Recent Labs    11/05/23 2027 11/06/23 0211 11/06/23 1631 11/07/23 0951 11/08/23 0211  HGB 7.3* 7.2* 7.5* 7.8* 8.0*  MCV 95.7 95.6 97.0 97.1 97.6  FERRITIN  --  49  --   --   --   TIBC  --  354  --   --   --   IRON  --  30*  --   --   --    Syncope Suspected to be secondary to hypotension from GI bleeding. Echo 2/21 with EF 55 to 60%, no WMA, trivial mitral regurg Blood pressure in low normal range.  Dizziness improving gradually  Elevated troponin Troponin peaked at 151 Likely demand ischemia due to hypotension Echo without WMA No further ischemia workup needed per cardiology  Lactic acidosis Lactic acid was initially elevated likely due to GI bleed.  Normalized    Hyperkalemia Resolved with replacement   Mobility: Encourage ambulation  Goals of care   Code Status: Full Code     DVT prophylaxis:  Place and maintain sequential compression device Start: 11/04/23 1623 SCDs Start: 11/04/23 1502  Antimicrobials: None Fluid: None Consultants: GI Family Communication: Wife at bedside  Status: Observation Level of care:  Progressive   Patient is from: Home Needs to continue in-hospital care: May need capsule endoscopy inpatient versus outpatient Anticipated d/c to: Hopefully home in 1 to 2 days      Diet:  Diet Order             Diet clear liquid Room service appropriate? Yes; Fluid consistency: Thin  Diet effective now                   Scheduled Meds:  pantoprazole  40 mg Oral Daily   sodium chloride flush  3-10 mL Intravenous Q12H     PRN meds: sodium chloride flush   Infusions:   iron sucrose 500 mg (11/08/23 1249)    Antimicrobials: Anti-infectives (From admission, onward)    Start     Dose/Rate Route Frequency Ordered Stop   11/04/23 1400  vancomycin (VANCOREADY) IVPB 1750 mg/350 mL        1,750 mg 175 mL/hr over 120 Minutes Intravenous  Once 11/04/23 1349 11/04/23 1805   11/04/23 1245  piperacillin-tazobactam (ZOSYN) IVPB 3.375 g        3.375 g 100 mL/hr over 30 Minutes Intravenous  Once 11/04/23 1230 11/04/23 1612       Objective: Vitals:   11/08/23 1023 11/08/23 1130  BP:  (!) 105/57  Pulse:    Resp:  18  Temp: (P) 98 F (36.7 C) 98.2 F (36.8 C)  SpO2:      Intake/Output Summary (Last 24 hours) at 11/08/2023 1339 Last data filed at 11/08/2023 0932 Gross per 24 hour  Intake 1140 ml  Output --  Net 1140 ml   Filed Weights   11/06/23 0639 11/07/23 0500 11/08/23 1118  Weight: 85 kg 87.3 kg 87.3 kg   Weight change:  Body mass index is 30.14 kg/m.   Physical Exam: General exam: Pleasant, young male.  Not in distress Skin: No rashes, lesions or ulcers. HEENT: Atraumatic, normocephalic, no obvious bleeding Lungs: Clear to auscultation bilaterally,  CVS: S1, S2, no murmur,   GI/Abd: Soft, nontender, nondistended, bowel sound present,   CNS: Alert, awake, oriented x 3 Psychiatry: Mood appropriate,  Extremities: No pedal edema, no calf tenderness,   Data Review: I have personally reviewed the laboratory data and studies available.  F/u labs ordered Unresulted Labs (From admission, onward)     Start     Ordered   11/08/23 0500  Basic metabolic panel  Daily,   R     Question:  Specimen collection method  Answer:  Lab=Lab collect   11/07/23 1202   11/08/23 0500  CBC with Differential/Platelet  Daily,   R     Question:  Specimen collection method  Answer:  Lab=Lab collect   11/07/23 1202            Total time spent in review of labs and imaging, patient evaluation,  formulation of plan, documentation and communication with family: 45 minutes  Signed, Lorin Glass, MD [facility] 11/08/2023

## 2023-11-09 ENCOUNTER — Other Ambulatory Visit (HOSPITAL_COMMUNITY): Payer: Self-pay

## 2023-11-09 ENCOUNTER — Observation Stay (HOSPITAL_COMMUNITY): Payer: Commercial Managed Care - PPO

## 2023-11-09 ENCOUNTER — Encounter (HOSPITAL_COMMUNITY): Payer: Self-pay | Admitting: Gastroenterology

## 2023-11-09 DIAGNOSIS — K922 Gastrointestinal hemorrhage, unspecified: Secondary | ICD-10-CM | POA: Diagnosis not present

## 2023-11-09 LAB — CBC WITH DIFFERENTIAL/PLATELET
Abs Immature Granulocytes: 2.92 10*3/uL — ABNORMAL HIGH (ref 0.00–0.07)
Basophils Absolute: 0.3 10*3/uL — ABNORMAL HIGH (ref 0.0–0.1)
Basophils Relative: 1 %
Eosinophils Absolute: 0.1 10*3/uL (ref 0.0–0.5)
Eosinophils Relative: 1 %
HCT: 27.3 % — ABNORMAL LOW (ref 39.0–52.0)
Hemoglobin: 8.8 g/dL — ABNORMAL LOW (ref 13.0–17.0)
Immature Granulocytes: 13 %
Lymphocytes Relative: 13 %
Lymphs Abs: 3 10*3/uL (ref 0.7–4.0)
MCH: 31.8 pg (ref 26.0–34.0)
MCHC: 32.2 g/dL (ref 30.0–36.0)
MCV: 98.6 fL (ref 80.0–100.0)
Monocytes Absolute: 1.9 10*3/uL — ABNORMAL HIGH (ref 0.1–1.0)
Monocytes Relative: 8 %
Neutro Abs: 14.7 10*3/uL — ABNORMAL HIGH (ref 1.7–7.7)
Neutrophils Relative %: 64 %
Platelets: 293 10*3/uL (ref 150–400)
RBC: 2.77 MIL/uL — ABNORMAL LOW (ref 4.22–5.81)
RDW: 16.5 % — ABNORMAL HIGH (ref 11.5–15.5)
Smear Review: NORMAL
WBC: 22.9 10*3/uL — ABNORMAL HIGH (ref 4.0–10.5)
nRBC: 17.7 % — ABNORMAL HIGH (ref 0.0–0.2)

## 2023-11-09 LAB — VITAMIN B12: Vitamin B-12: 310 pg/mL (ref 180–914)

## 2023-11-09 LAB — CULTURE, BLOOD (ROUTINE X 2)
Culture: NO GROWTH
Culture: NO GROWTH
Special Requests: ADEQUATE
Special Requests: ADEQUATE

## 2023-11-09 LAB — BASIC METABOLIC PANEL
Anion gap: 11 (ref 5–15)
BUN: 17 mg/dL (ref 6–20)
CO2: 27 mmol/L (ref 22–32)
Calcium: 7.8 mg/dL — ABNORMAL LOW (ref 8.9–10.3)
Chloride: 98 mmol/L (ref 98–111)
Creatinine, Ser: 1.13 mg/dL (ref 0.61–1.24)
GFR, Estimated: >60 mL/min (ref >60)
Glucose, Bld: 141 mg/dL — ABNORMAL HIGH (ref 70–99)
Potassium: 3.7 mmol/L (ref 3.5–5.1)
Sodium: 136 mmol/L (ref 135–145)

## 2023-11-09 LAB — FOLATE: Folate: 6.9 ng/mL (ref >5.9)

## 2023-11-09 LAB — PROCALCITONIN: Procalcitonin: 0.18 ng/mL

## 2023-11-09 LAB — PHOSPHORUS: Phosphorus: 3.6 mg/dL (ref 2.5–4.6)

## 2023-11-09 LAB — MAGNESIUM: Magnesium: 2 mg/dL (ref 1.7–2.4)

## 2023-11-09 LAB — VITAMIN D 25 HYDROXY (VIT D DEFICIENCY, FRACTURES): Vit D, 25-Hydroxy: 16.47 ng/mL — ABNORMAL LOW (ref 30–100)

## 2023-11-09 MED ORDER — FE FUM-VIT C-VIT B12-FA 460-60-0.01-1 MG PO CAPS
1.0000 | ORAL_CAPSULE | Freq: Every day | ORAL | Status: DC
  Administered 2023-11-09: 1 via ORAL
  Filled 2023-11-09: qty 1

## 2023-11-09 MED ORDER — VITAMIN D (ERGOCALCIFEROL) 1.25 MG (50000 UNIT) PO CAPS
50000.0000 [IU] | ORAL_CAPSULE | ORAL | Status: DC
  Administered 2023-11-09: 50000 [IU] via ORAL
  Filled 2023-11-09: qty 1

## 2023-11-09 MED ORDER — APIXABAN (ELIQUIS) VTE STARTER PACK (10MG AND 5MG)
ORAL_TABLET | ORAL | 0 refills | Status: AC
  Filled 2023-11-09: qty 74, 30d supply, fill #0

## 2023-11-09 MED ORDER — FE FUM-VIT C-VIT B12-FA 460-60-0.01-1 MG PO CAPS
1.0000 | ORAL_CAPSULE | Freq: Every day | ORAL | 5 refills | Status: AC
  Filled 2023-11-09: qty 30, 30d supply, fill #0

## 2023-11-09 MED ORDER — APIXABAN 5 MG PO TABS: 5.0000 mg | ORAL_TABLET | Freq: Two times a day (BID) | ORAL | Status: DC

## 2023-11-09 MED ORDER — APIXABAN 5 MG PO TABS
10.0000 mg | ORAL_TABLET | Freq: Two times a day (BID) | ORAL | Status: DC
  Administered 2023-11-09: 10 mg via ORAL
  Filled 2023-11-09: qty 2

## 2023-11-09 MED ORDER — VITAMIN B-12 1000 MCG PO TABS: 1000.0000 ug | ORAL_TABLET | Freq: Every day | ORAL | Status: DC

## 2023-11-09 MED ORDER — VITAMIN D (ERGOCALCIFEROL) 1.25 MG (50000 UNIT) PO CAPS
50000.0000 [IU] | ORAL_CAPSULE | ORAL | 0 refills | Status: AC
  Filled 2023-11-09: qty 12, 84d supply, fill #0

## 2023-11-09 MED ORDER — PANTOPRAZOLE SODIUM 40 MG PO TBEC
40.0000 mg | DELAYED_RELEASE_TABLET | Freq: Every day | ORAL | 2 refills | Status: AC
  Filled 2023-11-09: qty 30, 30d supply, fill #0

## 2023-11-09 MED ORDER — APIXABAN 5 MG PO TABS
5.0000 mg | ORAL_TABLET | Freq: Two times a day (BID) | ORAL | 0 refills | Status: AC
  Filled 2023-11-09: qty 28, 14d supply, fill #0

## 2023-11-09 NOTE — TOC Transition Note (Signed)
 Transition of Care [facility]) - Discharge Note   Patient Details  Name: Robert Sanford MRN: [id number] Date of Birth: February 28, 1986  [facility] CM/SW Contact:  Lawerance Sabal, RN Phone Number: 11/09/2023, 3:21 PM   Clinical Narrative:     Patient to DC to home. Initial meds to be filled through [facility], provided with Eliquis copay coupon to use for refills.   Final next level of care: Home/Self Care Barriers to Discharge: No Barriers Identified   Patient Goals and CMS Choice            Discharge Placement                       Discharge Plan and Services Additional resources added to the After Visit Summary for                                       Social Drivers of Health (SDOH) Interventions SDOH Screenings   Food Insecurity: No Food Insecurity (11/04/2023)  Housing: Low Risk  (11/04/2023)  Transportation Needs: No Transportation Needs (11/04/2023)  Utilities: Not At Risk (11/04/2023)  Tobacco Use: Medium Risk (11/08/2023)     Readmission Risk Interventions     No data to display

## 2023-11-09 NOTE — Progress Notes (Signed)
 Received call from [facility] radiology about patients Left upper Extremity ultrasound that was done last night 11/08/23. They stated that there was an occlusive thrombus of Left Cephalic vein of region of poor IV placement. Notified MD, and got orders for warm compress and he would consult vascular. Will continue patient.

## 2023-11-09 NOTE — Plan of Care (Signed)

## 2023-11-09 NOTE — Progress Notes (Signed)
 Patient stating that Left Upper arm extremity was swollen and painful. Upon assessment, patients arm was swollen, red and warm to the touch. Patient stated that his PIV was leaking, he had been getting iv iron sucrose. Paged MD and got orders for ice and elevation. Will continue to monitor patient.

## 2023-11-09 NOTE — Discharge Summary (Signed)
 [facility] Discharge Summary   Patient: Robert Sanford XBJ:[id number]  PCP: Verl Blalock  Date of admission: 11/04/2023   Date of discharge:  11/09/2023     Discharge Diagnoses:  Principal Problem: # Acute GI bleeding # Left cephalic vein thrombus  Active Problems:   Syncope   Hyperkalemia   Elevated CK   Admitted From: Home Disposition:  Home   Recommendations for Outpatient Follow-up:  Follow-up with PCP in 1 week, repeat CBC in 1 week, repeat iron profile, B12, folic acid and vitamin D level after 3 to 6 months. Follow-up with GI in 1 week for Biopsy report and abdominal x-ray to check on capsule endoscopy.  Repeat CBC in 1 week Started Eliquis VTE dose for left cephalic vein occlusive thrombus.  Risk and benefit explained to patient.  Advised to return back to ED if any signs of bleeding. Follow up LABS/TEST: CBC in 1 week, iron profile, folate, B12, and vitamin D level in 3 to 6 months Follow-up with GI for biopsy report   Follow-up Information     Sheliah Hatch, PA-C Follow up in 1 week(s).   Specialty: Family Medicine Contact information: [address] [place] [address] [phone]         Willis Modena, MD Follow up in 1 week(s).   Specialty: Gastroenterology Why: Biopsy report and abdominal x-ray to check on capsule endoscopy Contact information: [address] [place] [address] [phone]                Diet recommendation: Regular diet  Activity: The patient is advised to gradually reintroduce usual activities, as tolerated  Discharge Condition: stable  Code Status: Full code   History of present illness: As per the H and P dictated on admission  Hospital Course:  Robert Sanford is a 38 y.o. 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 2/20, patient was brought to the ED for evaluation of syncopal episode.   Patient lives at home with his wife.   He always does strenuous routines at the gym twice a day.  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 He takes ibuprofen for back pain.  2/6, seen by cardiology Dr. Lynnette Caffey as an outpatient for chest pain, suspected to be musculoskeletal in nature.  Stress echo was scheduled but patient later canceled.   2 weeks ago, patient noted dark stool.   2/20, he states her nausea and went to the bathroom to throw up, felt like having a bowel movement and next thing he remembers is his wife doing CPR on him.  His wife heard him fall and came over to help him immediately.  It is unclear if patient lost any pulse.  When he woke up, he was able to talk coherent and was back to baseline.  Does not recall any premonition symptoms before collapsing.  EMS was called and brought to the ED   In the ED, initially tachycardic Labs with hemoglobin 11, FOBT positive  CK13 182, potassium 6.7, creatinine 1.39, lactic acid 3.5 Troponin 81> 151 Chest x-ray without any acute abnormality   Admitted with possible sepsis and rhabdomyolysis GI, cardiology were consulted.    Assessment and plan:  # Acute blood loss anemia Initially had a hemoglobin of 11 with FOBT positive. Within 24 hours, hemoglobin dropped down to 7.9. Reportedly was taking ibuprofen intermittently for musculoskeletal pain.  Reported few episodes of melena/black stool  in last 2 weeks.GI consulted. 2/21 S/p EGD:  Nodule found in the esophagus. Biopsied. Erythematous mucosa in the antrum. Biopsied. 2/24, colonoscopy was normal.  2/24 S/p capsule endoscopy:  Capsule apparently took a while to leave the stomach (much longer than usual) and because of this, capsule views were obscured by food/debris. No obvious clear bleeding site. given stable Hgb I would advise patient go home and we can follow-up with him as outpatient.  abdominal xray next week to make sure capsule eventually has entirely left the GI tract.  Follow-up with  GI in 1 week for abdominal x-ray and biopsy report. Patient was cleared by GI to start Eliquis for left cephalic vein occlusive thrombus.  Follow with PCP and GI to repeat CBC in 1 week. Continue pantoprazole 40 mg p.o. daily  # Iron deficiency: transferrin saturation 9%, patient was given IV Venofer infusion during hospital stay. Folic acid level 6.9 at lower end and B12 level 310 goal >400, started try gel which contains iron, folic acid and B12.  Patient was advised to follow-up with PCP to repeat iron profile, folate and B12 level after 3 to 6 months.  # Vitamin D deficiency: started vitamin D 50,000 units p.o. weekly, follow with PCP to repeat vitamin D level after 3 to 6 months.  # Left cephalic vein thrombus Patient developed left arm swelling after peripheral IV access. US venous Doppler: Occlusive thrombus within the left cephalic (deep) vein in the region of the prior IV placement. This clot extends from the region of the mid biceps to the mid forearm. D/w vascular surgery, recommended to start DOAC for 6 weeks.  Patient was discharged on Eliquis 10 mg p.o. twice daily for 7 days followed by 5 mg p.o. twice daily for total 6 weeks.  Risk and benefit explained.  Patient was advised to return back to ED if any signs of bleeding.  Follow with PCP and GI in 1 week to repeat CBC.  Continue warm compresses.  Avoid NSAIDs.  # Syncope: Suspected to be secondary to hypotension from GI bleeding. Echo 2/21 with EF 55 to 60%, no WMA, trivial mitral regurg Blood pressure in low normal range.  Dizziness improved gradually, currently asymptomatic.   # Elevated troponin: Troponin peaked at 151, Likely demand ischemia due to hypotension, Echo without WMA. No further ischemia workup needed per cardiology # Lactic acidosis: Lactic acid was initially elevated likely due to GI bleed.  Normalized # Hyperkalemia: Resolved. # Rhabdomyolysis, [id number], resolved gradually # Leukocytosis, most likely  reactive due to left cephalic vein occlusive thrombus.  Procalcitonin negative.  No signs of infection.  Repeat CBC in 1 week, follow with PCP.  Body mass index is 29.76 kg/m.  Nutrition Interventions:  Patient was ambulatory without any assistance. On the day of the discharge the patient's vitals were stable, and no other acute medical condition were reported by patient. the patient was felt safe to be discharge at Home.  Consultants: GI, cardiology,  Curbside d/w vascular surgery Procedures: EGD, colonoscopy and capsule endoscopy  Discharge Exam: General: Appear in no distress, no Rash; Oral Mucosa Clear, moist. Cardiovascular: S1 and S2 Present, no Murmur, Respiratory: normal respiratory effort, Bilateral Air entry present and no Crackles, no wheezes Abdomen: Bowel Sound present, Soft and no tenderness, no hernia Extremities: no Pedal edema, no calf tenderness Neurology: alert and oriented to time, place, and person affect appropriate.  Filed Weights   11/07/23 0500 11/08/23 1118 11/09/23 0400  Weight: 87.3 kg 87.3 kg  86.2 kg   Vitals:   11/09/23 0744 11/09/23 1123  BP: 124/60 115/62  Pulse: 89 84  Resp: 20 17  Temp: 99.8 F (37.7 C) 98.5 F (36.9 C)  SpO2: 99% 99%    DISCHARGE MEDICATION: Allergies as of 11/09/2023   No Known Allergies      Medication List     STOP taking these medications    ibuprofen 800 MG tablet Commonly known as: ADVIL   sertraline 50 MG tablet Commonly known as: ZOLOFT       TAKE these medications    Apixaban Starter Pack (10mg  and 5mg ) Commonly known as: ELIQUIS STARTER PACK Take as directed on package: start with two-5mg  tablets twice daily for 7 days. On day 8, switch to one-5mg  tablet twice daily.   apixaban 5 MG Tabs tablet Commonly known as: ELIQUIS Take 1 tablet (5 mg total) by mouth 2 (two) times daily for 14 days. Start taking on: December 07, 2023   Fe Fum-Vit C-Vit B12-FA Caps capsule Commonly known as: TRIGELS-F  FORTE Take 1 capsule by mouth daily after breakfast. Start taking on: November 10, 2023   pantoprazole 40 MG tablet Commonly known as: PROTONIX Take 1 tablet (40 mg total) by mouth daily. Start taking on: November 10, 2023   testosterone cypionate 200 MG/ML injection Commonly known as: DEPOTESTOSTERONE CYPIONATE Inject 200 mg into the muscle every 14 (fourteen) days.   Vitamin D (Ergocalciferol) 1.25 MG (50000 UNIT) Caps capsule Commonly known as: DRISDOL Take 1 capsule (50,000 Units total) by mouth every 7 (seven) days.       No Known Allergies Discharge Instructions     Call MD for:   Complete by: As directed    GI bleeding or bruises   Call MD for:  difficulty breathing, headache or visual disturbances   Complete by: As directed    Call MD for:  extreme fatigue   Complete by: As directed    Call MD for:  persistant dizziness or light-headedness   Complete by: As directed    Call MD for:  persistant nausea and vomiting   Complete by: As directed    Call MD for:  severe uncontrolled pain   Complete by: As directed    Call MD for:  temperature >100.4   Complete by: As directed    Diet general   Complete by: As directed    Discharge instructions   Complete by: As directed    Follow-up with PCP in 1 week, repeat CBC in 1 week, repeat iron profile, B12, folic acid and vitamin D level after 3 to 6 months. Follow-up with GI in 1 week for Biopsy report and abdominal x-ray to check on capsule endoscopy.  Repeat CBC in 1 week Started Eliquis VTE dose for left cephalic vein occlusive thrombus.  Risk and benefit explained to patient.  Advised to return back to ED if any signs of bleeding.   Increase activity slowly   Complete by: As directed        The results of significant diagnostics from this hospitalization (including imaging, microbiology, ancillary and laboratory) are listed below for reference.    Significant Diagnostic Studies: [place] LT UPPER EXTREM LTD SOFT TISSUE  NON VASCULAR Result Date: 11/09/2023 CLINICAL DATA:  IV site infiltration. EXAM: ULTRASOUND LEFT UPPER EXTREMITY LIMITED TECHNIQUE: Ultrasound examination of the upper extremity soft tissues was performed in the area of clinical concern. COMPARISON:  None Available. FINDINGS: The patient's area of IV site infiltration apparently was the  left cephalic vein. There is an occlusive thrombus within the left cephalic vein in the region of the IV placement. This extends from the mid biceps to the mid forearm. The vein does not compress. There is mild surrounding soft tissue edema. The sonographer reports the other surrounding arm veins were compressible. IMPRESSION: Occlusive thrombus within the left cephalic (deep) vein in the region of the prior IV placement. This clot extends from the region of the mid biceps to the mid forearm. These results will be called to the ordering clinician or representative by the Radiologist Assistant, and communication documented in the PACS or [employer]. Electronically Signed   By: Neita Garnet M.D.   On: 11/09/2023 09:35   ECHOCARDIOGRAM COMPLETE Result Date: 11/05/2023    ECHOCARDIOGRAM REPORT   Patient Name:   Robert Sanford Date of Exam: 11/05/2023 Medical Rec #:  [id number]      Height:       67.0 in Accession #:    [id number]     Weight:       192.6 lb Date of Birth:  Aug 22, 1986      BSA:          1.990 m Patient Age:    37 years       BP:           115/65 mmHg Patient Gender: M              HR:           85 bpm. Exam Location:  Inpatient Procedure: 2D Echo, Cardiac Doppler, Color Doppler, 3D Echo and Strain Analysis            (Both Spectral and Color Flow Doppler were utilized during            procedure). Indications:    R55 Syncope  History:        Patient has no prior history of Echocardiogram examinations.                 Signs/Symptoms:Syncope and Chest Pain. Elevated troponin.  Sonographer:    Sheralyn Boatman RDCS Referring Phys: [id number] Jonita Albee IMPRESSIONS  1.  Left ventricular ejection fraction, by estimation, is 55 to 60%. The left ventricle has normal function. The left ventricle has no regional wall motion abnormalities. There is mild concentric left ventricular hypertrophy. Left ventricular diastolic parameters were normal. The average left ventricular global longitudinal strain is -14.3 %. The global longitudinal strain is abnormal.  2. Right ventricular systolic function is low normal. The right ventricular size is normal.  3. The mitral valve is normal in structure. Trivial mitral valve regurgitation. No evidence of mitral stenosis.  4. The aortic valve is tricuspid. Aortic valve regurgitation is not visualized. No aortic stenosis is present.  5. The inferior vena cava is normal in size with greater than 50% respiratory variability, suggesting right atrial pressure of 3 mmHg. Comparison(s): No prior Echocardiogram. FINDINGS  Left Ventricle: Left ventricular ejection fraction, by estimation, is 55 to 60%. The left ventricle has normal function. The left ventricle has no regional wall motion abnormalities. The average left ventricular global longitudinal strain is -14.3 %. Strain was performed and the global longitudinal strain is abnormal. The left ventricular internal cavity size was small. There is mild concentric left ventricular hypertrophy. Left ventricular diastolic parameters were normal. Right Ventricle: The right ventricular size is normal. Right ventricular systolic function is low normal. Left Atrium: Left atrial size was normal in size. Right Atrium: Right  atrial size was normal in size. Pericardium: Trivial pericardial effusion is present. Mitral Valve: The mitral valve is normal in structure. Trivial mitral valve regurgitation. No evidence of mitral valve stenosis. Tricuspid Valve: The tricuspid valve is normal in structure. Tricuspid valve regurgitation is trivial. No evidence of tricuspid stenosis. Aortic Valve: The aortic valve is tricuspid. Aortic  valve regurgitation is not visualized. No aortic stenosis is present. Pulmonic Valve: The pulmonic valve was normal in structure. Pulmonic valve regurgitation is trivial. No evidence of pulmonic stenosis. Aorta: The aortic root and ascending aorta are structurally normal, with no evidence of dilitation. Venous: The inferior vena cava is normal in size with greater than 50% respiratory variability, suggesting right atrial pressure of 3 mmHg. IAS/Shunts: No atrial level shunt detected by color flow Doppler. Additional Comments: 3D was performed not requiring image post processing on an independent workstation and was normal.  LEFT VENTRICLE PLAX 2D LVIDd:         4.80 cm     Diastology LVIDs:         3.40 cm     LV e' medial:    13.20 cm/s LV PW:         1.40 cm     LV E/e' medial:  4.6 LV IVS:        1.10 cm     LV e' lateral:   12.60 cm/s LVOT diam:     2.30 cm     LV E/e' lateral: 4.8 LV SV:         66 LV SV Index:   33          2D Longitudinal Strain LVOT Area:     4.15 cm    2D Strain GLS Avg:     -14.3 %  LV Volumes (MOD) LV vol d, MOD A2C: 99.2 ml 3D Volume EF: LV vol d, MOD A4C: 86.0 ml 3D EF:        54 % LV vol s, MOD A2C: 53.5 ml LV vol s, MOD A4C: 40.3 ml LV SV MOD A2C:     45.7 ml LV SV MOD A4C:     86.0 ml LV SV MOD BP:      44.3 ml RIGHT VENTRICLE             IVC RV S prime:     10.30 cm/s  IVC diam: 1.80 cm TAPSE (M-mode): 2.3 cm LEFT ATRIUM             Index        RIGHT ATRIUM           Index LA diam:        2.60 cm 1.31 cm/m   RA Area:     12.80 cm LA Vol (A2C):   30.4 ml 15.28 ml/m  RA Volume:   29.40 ml  14.77 ml/m LA Vol (A4C):   22.6 ml 11.36 ml/m LA Biplane Vol: 27.4 ml 13.77 ml/m  AORTIC VALVE LVOT Vmax:   103.00 cm/s LVOT Vmean:  68.400 cm/s LVOT VTI:    0.160 m  AORTA Ao Root diam: 3.10 cm Ao Asc diam:  2.90 cm MITRAL VALVE MV Area (PHT): 4.35 cm    SHUNTS MV Decel Time: 175 msec    Systemic VTI:  0.16 m MV E velocity: 60.20 cm/s  Systemic Diam: 2.30 cm MV A velocity: 52.80 cm/s MV  E/A ratio:  1.14 Olga Millers MD Electronically signed by Olga Millers MD Signature Date/Time: 11/05/2023/11:44:40 AM  Final    EEG adult Result Date: 11/05/2023 Charlsie Quest, MD     11/05/2023  8:43 AM Patient Name: Robert Sanford MRN: [id number] Epilepsy Attending: Charlsie Quest Referring Physician/Provider: Faith Rogue, DO Date: 11/04/2023 Duration: 23.06 mins Patient history: 38yo M with syncope. EEG to evaluate for seizure Level of alertness: Awake, asleep AEDs during EEG study: None Technical aspects: This EEG study was done with scalp electrodes positioned according to the 10-20 International system of electrode placement. Electrical activity was reviewed with band pass filter of 1-70Hz , sensitivity of 7 uV/mm, display speed of 1mm/sec with a 60Hz  notched filter applied as appropriate. EEG data were recorded continuously and digitally stored.  Video monitoring was available and reviewed as appropriate. Description: The posterior dominant rhythm consists of 9 Hz activity of moderate voltage (25-35 uV) seen predominantly in posterior head regions, symmetric and reactive to eye opening and eye closing. Sleep was characterized by vertex waves, sleep spindles (12 to 14 Hz), maximal frontocentral region. Hyperventilation and photic stimulation were not performed.   IMPRESSION: This study is within normal limits. No seizures or epileptiform discharges were seen throughout the recording. A normal interictal EEG does not exclude the diagnosis of epilepsy. Charlsie Quest   CT ABDOMEN PELVIS W CONTRAST Result Date: 11/04/2023 CLINICAL DATA:  Abdominal pain. EXAM: CT ABDOMEN AND PELVIS WITH CONTRAST TECHNIQUE: Multidetector CT imaging of the abdomen and pelvis was performed using the standard protocol following bolus administration of intravenous contrast. RADIATION DOSE REDUCTION: This exam was performed according to the departmental dose-optimization program which includes automated exposure  control, adjustment of the mA and/or kV according to patient size and/or use of iterative reconstruction technique. CONTRAST:  75mL OMNIPAQUE IOHEXOL 350 MG/ML SOLN COMPARISON:  None Available. FINDINGS: Lower chest: The visualized lung bases are clear. No intra-abdominal free air or free fluid. Hepatobiliary: The liver is unremarkable. No biliary dilatation. The gallbladder is unremarkable. Pancreas: Unremarkable. No pancreatic ductal dilatation or surrounding inflammatory changes. Spleen: Normal in size without focal abnormality. Adrenals/Urinary Tract: The adrenal glands unremarkable. Small left renal inferior pole cyst. There is no hydronephrosis on either side. There is symmetric enhancement and excretion of contrast by both kidneys. The visualized ureters and urinary bladder appear unremarkable. Stomach/Bowel: There is no bowel obstruction or active inflammation. The appendix is normal. Vascular/Lymphatic: The abdominal aorta and IVC are unremarkable. No portal venous gas. There is no adenopathy. Reproductive: The prostate and seminal vesicles are grossly unremarkable. No pelvic mass. Other: None Musculoskeletal: Mild diffuse subcutaneous edema. No fluid collection. No acute osseous pathology. IMPRESSION: 1. No acute intra-abdominal or pelvic pathology. 2. No bowel obstruction. Normal appendix. Electronically Signed   By: Elgie Collard M.D.   On: 11/04/2023 13:01   CT Head Wo Contrast Result Date: 11/04/2023 CLINICAL DATA:  Ataxia, head trauma EXAM: CT HEAD WITHOUT CONTRAST TECHNIQUE: Contiguous axial images were obtained from the base of the skull through the vertex without intravenous contrast. RADIATION DOSE REDUCTION: This exam was performed according to the departmental dose-optimization program which includes automated exposure control, adjustment of the mA and/or kV according to patient size and/or use of iterative reconstruction technique. COMPARISON:  MRI head July 06, 2016. FINDINGS: Brain:  No evidence of acute infarction, hemorrhage, hydrocephalus, extra-axial collection or mass lesion/mass effect. Vascular: No hyperdense vessel. Skull: No acute fracture. Sinuses/Orbits: Clear sinuses.  No acute orbital findings. Other: No mastoid effusions. IMPRESSION: No evidence of acute intracranial abnormality. Electronically Signed   By: Juluis Mire.D.  On: 11/04/2023 12:55   DG Chest Portable 1 View Result Date: 11/04/2023 CLINICAL DATA:  Syncope EXAM: PORTABLE CHEST 1 VIEW COMPARISON:  May 25, 2020. FINDINGS: The cardiomediastinal silhouette is unchanged in contour. No pleural effusion. No pneumothorax. No acute pleuroparenchymal abnormality. IMPRESSION: No acute cardiopulmonary abnormality. Electronically Signed   By: Meda Klinefelter M.D.   On: 11/04/2023 11:04    Microbiology: Recent Results (from the past 240 hours)  Resp panel by RT-PCR (RSV, Flu A&B, Covid) Anterior Nasal Swab     Status: None   Collection Time: 11/04/23 10:30 AM   Specimen: Anterior Nasal Swab  Result Value Ref Range Status   SARS Coronavirus 2 by RT PCR NEGATIVE NEGATIVE Final   Influenza A by PCR NEGATIVE NEGATIVE Final   Influenza B by PCR NEGATIVE NEGATIVE Final    Comment: (NOTE) The Xpert Xpress SARS-CoV-2/FLU/RSV plus assay is intended as an aid in the diagnosis of influenza from Nasopharyngeal swab specimens and should not be used as a sole basis for treatment. Nasal washings and aspirates are unacceptable for Xpert Xpress SARS-CoV-2/FLU/RSV testing.  Fact Sheet for Patients: BloggerCourse.com  Fact Sheet for Healthcare Providers: SeriousBroker.it  This test is not yet approved or cleared by the [place] FDA and has been authorized for detection and/or diagnosis of SARS-CoV-2 by FDA under an Emergency Use Authorization (EUA). This EUA will remain in effect (meaning this test can be used) for the duration of the COVID-19  declaration under Section 564(b)(1) of the Act, 21 U.S.C. section 360bbb-3(b)(1), unless the authorization is terminated or revoked.     Resp Syncytial Virus by PCR NEGATIVE NEGATIVE Final    Comment: (NOTE) Fact Sheet for Patients: BloggerCourse.com  Fact Sheet for Healthcare Providers: SeriousBroker.it  This test is not yet approved or cleared by the [place] FDA and has been authorized for detection and/or diagnosis of SARS-CoV-2 by FDA under an Emergency Use Authorization (EUA). This EUA will remain in effect (meaning this test can be used) for the duration of the COVID-19 declaration under Section 564(b)(1) of the Act, 21 U.S.C. section 360bbb-3(b)(1), unless the authorization is terminated or revoked.  Performed at [facility], [address], [place], [place] [address]   Blood culture (routine x 2)     Status: None   Collection Time: 11/04/23 11:42 AM   Specimen: BLOOD RIGHT FOREARM  Result Value Ref Range Status   Specimen Description BLOOD RIGHT FOREARM  Final   Special Requests   Final    BOTTLES DRAWN AEROBIC AND ANAEROBIC Blood Culture adequate volume   Culture   Final    NO GROWTH 5 DAYS Performed at [facility], [address], [place], [place] [address]    Report Status 11/09/2023 FINAL  Final  Blood culture (routine x 2)     Status: None   Collection Time: 11/04/23  3:39 PM   Specimen: BLOOD  Result Value Ref Range Status   Specimen Description BLOOD RIGHT ANTECUBITAL  Final   Special Requests   Final    BOTTLES DRAWN AEROBIC AND ANAEROBIC Blood Culture adequate volume   Culture   Final    NO GROWTH 5 DAYS Performed at [facility], [address], [place], [place] [address]    Report Status 11/09/2023 FINAL  Final     Labs: CBC: Recent Labs  Lab 11/04/23 1038 11/05/23 0214 11/06/23 0211 11/06/23 1631 11/07/23 0951 11/08/23 0211 11/09/23 0208  WBC 17.2*   < > 10.0  11.2* 17.5* 19.6* 22.9*  NEUTROABS 12.3*  --   --  9.6* 13.8* 16.3* 14.7*  HGB 11.0*   < > 7.2* 7.5* 7.8* 8.0* 8.8*  HCT 33.8*   < > 21.7* 22.4* 23.3* 24.6* 27.3*  MCV 98.0   < > 95.6 97.0 97.1 97.6 98.6  PLT 242   < > 191 206 228 268 293   < > = values in this interval not displayed.   Basic Metabolic Panel: Recent Labs  Lab 11/04/23 1539 11/04/23 2207 11/05/23 0214 11/06/23 0211 11/08/23 0211 11/09/23 0208 11/09/23 0911  NA 135  --  136 140 137 136  --   K 5.1  --  3.8 4.0 4.0 3.7  --   CL 98  --  97* 98 100 98  --   CO2 29  --  29 29 28 27   --   GLUCOSE 93  --  91 101* 93 141*  --   BUN 29*  --  25* 19 14 17   --   CREATININE 1.18  --  1.10 1.03 1.05 1.13  --   CALCIUM 8.0*  --  7.6* 8.6* 7.6* 7.8*  --   MG  --  1.7  --  1.8  --   --  2.0  PHOS  --   --   --   --   --   --  3.6   Liver Function Tests: Recent Labs  Lab 11/04/23 1038 11/05/23 0214  AST 48* 31  ALT 42 30  ALKPHOS 21* 17*  BILITOT 0.7 0.6  PROT 4.9* 4.1*  ALBUMIN 2.8* 2.3*   No results for input(s): "LIPASE", "AMYLASE" in the last 168 hours. No results for input(s): "AMMONIA" in the last 168 hours. Cardiac Enzymes: Recent Labs  Lab 11/04/23 1234 11/05/23 0214 11/06/23 0211  CKTOTAL 1,382* 758* 325   BNP (last 3 results) No results for input(s): "BNP" in the last 8760 hours. CBG: No results for input(s): "GLUCAP" in the last 168 hours.  Time spent: 35 minutes  Signed:  Gillis Santa  [facility] 11/09/2023 2:14 PM

## 2023-11-09 NOTE — Discharge Instructions (Signed)
Information on my medicine - ELIQUIS (apixaban)  Why was Eliquis prescribed for you? Eliquis was prescribed to treat blood clots that may have been found in the veins of your legs (deep vein thrombosis) or in your lungs (pulmonary embolism) and to reduce the risk of them occurring again.  What do You need to know about Eliquis ? The starting dose is 10 mg (two 5 mg tablets) taken TWICE daily for the FIRST SEVEN (7) DAYS, then  the dose is reduced to ONE 5 mg tablet taken TWICE daily.  Eliquis may be taken with or without food.   Try to take the dose about the same time in the morning and in the evening. If you have difficulty swallowing the tablet whole please discuss with your pharmacist how to take the medication safely.  Take Eliquis exactly as prescribed and DO NOT stop taking Eliquis without talking to the doctor who prescribed the medication.  Stopping may increase your risk of developing a new blood clot.  Refill your prescription before you run out.  After discharge, you should have regular check-up appointments with your healthcare provider that is prescribing your Eliquis.    What do you do if you miss a dose? If a dose of ELIQUIS is not taken at the scheduled time, take it as soon as possible on the same day and twice-daily administration should be resumed. The dose should not be doubled to make up for a missed dose.  Important Safety Information A possible side effect of Eliquis is bleeding. You should call your healthcare provider right away if you experience any of the following: Bleeding from an injury or your nose that does not stop. Unusual colored urine (red or dark brown) or unusual colored stools (red or black). Unusual bruising for unknown reasons. A serious fall or if you hit your head (even if there is no bleeding).  Some medicines may interact with Eliquis and might increase your risk of bleeding or clotting while on Eliquis. To help avoid this, consult  your healthcare provider or pharmacist prior to using any new prescription or non-prescription medications, including herbals, vitamins, non-steroidal anti-inflammatory drugs (NSAIDs) and supplements.  This website has more information on Eliquis (apixaban): http://[email or web address].com/eliquis/home
